# Patient Record
Sex: Male | Born: 1945 | Race: Black or African American | Hispanic: No | Marital: Single | Smoking: Former smoker
Health system: Southern US, Community
[De-identification: ages and names within clinical notes are randomized; demographics above are authoritative.]

## PROBLEM LIST (undated history)

## (undated) DIAGNOSIS — E785 Hyperlipidemia, unspecified: Secondary | ICD-10-CM

## (undated) DIAGNOSIS — C61 Malignant neoplasm of prostate: Secondary | ICD-10-CM

## (undated) DIAGNOSIS — I4819 Other persistent atrial fibrillation: Secondary | ICD-10-CM

## (undated) DIAGNOSIS — N200 Calculus of kidney: Secondary | ICD-10-CM

## (undated) DIAGNOSIS — S37001A Unspecified injury of right kidney, initial encounter: Secondary | ICD-10-CM

## (undated) DIAGNOSIS — Z992 Dependence on renal dialysis: Secondary | ICD-10-CM

## (undated) DIAGNOSIS — S2239XA Fracture of one rib, unspecified side, initial encounter for closed fracture: Secondary | ICD-10-CM

## (undated) DIAGNOSIS — K5730 Diverticulosis of large intestine without perforation or abscess without bleeding: Secondary | ICD-10-CM

## (undated) DIAGNOSIS — Z87442 Personal history of urinary calculi: Secondary | ICD-10-CM

## (undated) DIAGNOSIS — I10 Essential (primary) hypertension: Secondary | ICD-10-CM

## (undated) DIAGNOSIS — N261 Atrophy of kidney (terminal): Secondary | ICD-10-CM

## (undated) DIAGNOSIS — I5032 Chronic diastolic (congestive) heart failure: Secondary | ICD-10-CM

## (undated) DIAGNOSIS — N186 End stage renal disease: Secondary | ICD-10-CM

## (undated) HISTORY — DX: Calculus of kidney: N20.0

## (undated) HISTORY — DX: Atrophy of kidney (terminal): N26.1

## (undated) HISTORY — DX: End stage renal disease: N18.6

## (undated) HISTORY — DX: Diverticulosis of large intestine without perforation or abscess without bleeding: K57.30

## (undated) HISTORY — DX: Unspecified injury of right kidney, initial encounter: S37.001A

## (undated) HISTORY — DX: Malignant neoplasm of prostate: C61

## (undated) HISTORY — DX: Hyperlipidemia, unspecified: E78.5

## (undated) HISTORY — DX: Personal history of urinary calculi: Z87.442

## (undated) HISTORY — DX: Essential (primary) hypertension: I10

## (undated) HISTORY — DX: Other persistent atrial fibrillation: I48.19

## (undated) HISTORY — DX: Fracture of one rib, unspecified side, initial encounter for closed fracture: S22.39XA

## (undated) HISTORY — DX: Dependence on renal dialysis: Z99.2

## (undated) HISTORY — DX: Chronic diastolic (congestive) heart failure: I50.32

---

## 1999-06-01 ENCOUNTER — Encounter: Payer: Self-pay | Admitting: Neurosurgery

## 1999-06-01 ENCOUNTER — Ambulatory Visit (HOSPITAL_COMMUNITY): Admission: RE | Admit: 1999-06-01 | Discharge: 1999-06-01 | Payer: Self-pay | Admitting: Neurosurgery

## 2000-07-03 DIAGNOSIS — C61 Malignant neoplasm of prostate: Secondary | ICD-10-CM

## 2000-07-03 HISTORY — PX: PROSTATECTOMY: SHX69

## 2000-07-03 HISTORY — DX: Malignant neoplasm of prostate: C61

## 2005-07-03 DIAGNOSIS — S2239XA Fracture of one rib, unspecified side, initial encounter for closed fracture: Secondary | ICD-10-CM

## 2005-07-03 HISTORY — DX: Fracture of one rib, unspecified side, initial encounter for closed fracture: S22.39XA

## 2006-03-26 ENCOUNTER — Ambulatory Visit: Payer: Self-pay | Admitting: Infectious Diseases

## 2006-04-02 ENCOUNTER — Ambulatory Visit: Payer: Self-pay | Admitting: Infectious Diseases

## 2007-09-03 ENCOUNTER — Ambulatory Visit: Payer: Self-pay | Admitting: Specialist

## 2008-02-24 ENCOUNTER — Ambulatory Visit: Payer: Self-pay | Admitting: Family Medicine

## 2008-12-04 ENCOUNTER — Emergency Department: Payer: Self-pay | Admitting: Emergency Medicine

## 2009-07-03 DIAGNOSIS — Z992 Dependence on renal dialysis: Secondary | ICD-10-CM

## 2009-07-03 DIAGNOSIS — N186 End stage renal disease: Secondary | ICD-10-CM

## 2009-07-03 HISTORY — DX: End stage renal disease: N18.6

## 2009-07-03 HISTORY — DX: End stage renal disease: Z99.2

## 2009-09-28 ENCOUNTER — Ambulatory Visit: Payer: Self-pay | Admitting: Internal Medicine

## 2009-10-01 ENCOUNTER — Ambulatory Visit: Payer: Self-pay | Admitting: Specialist

## 2009-12-31 HISTORY — PX: OTHER SURGICAL HISTORY: SHX169

## 2011-03-18 ENCOUNTER — Encounter: Payer: Self-pay | Admitting: Internal Medicine

## 2011-03-20 ENCOUNTER — Other Ambulatory Visit: Payer: Self-pay | Admitting: Internal Medicine

## 2011-03-21 ENCOUNTER — Telehealth: Payer: Self-pay | Admitting: Internal Medicine

## 2011-03-21 ENCOUNTER — Other Ambulatory Visit: Payer: Self-pay | Admitting: *Deleted

## 2011-03-21 NOTE — Telephone Encounter (Signed)
PT WIFE CALLED WANTS SAMPLES OF AMLODIPENE   5 MG  PT IS COMPLETELY OUT.  PLEASE SEND RX TO [facility]

## 2011-03-23 ENCOUNTER — Other Ambulatory Visit: Payer: Self-pay | Admitting: Internal Medicine

## 2011-06-16 ENCOUNTER — Other Ambulatory Visit: Payer: Self-pay | Admitting: Internal Medicine

## 2011-06-16 MED ORDER — AMLODIPINE BESYLATE 5 MG PO TABS: 5.0000 mg | ORAL_TABLET | Freq: Every day | ORAL | Status: DC

## 2011-06-19 NOTE — Telephone Encounter (Signed)
PLEASE RESEND MED.

## 2011-10-16 ENCOUNTER — Other Ambulatory Visit: Payer: Self-pay | Admitting: Internal Medicine

## 2011-10-16 ENCOUNTER — Telehealth: Payer: Self-pay | Admitting: Internal Medicine

## 2011-10-16 NOTE — Telephone Encounter (Signed)
Rx has been called in.  Patient notified. 

## 2011-10-16 NOTE — Telephone Encounter (Signed)
[phone] Pt checking on his refill for amlodipine  5mg  1 daily [facility] in [place] Pt has 1 pill left

## 2012-01-17 ENCOUNTER — Encounter: Payer: Self-pay | Admitting: Internal Medicine

## 2012-01-17 ENCOUNTER — Ambulatory Visit (INDEPENDENT_AMBULATORY_CARE_PROVIDER_SITE_OTHER): Payer: Medicare Other | Admitting: Internal Medicine

## 2012-01-17 VITALS — BP 150/88 | HR 87 | Temp 98.7°F | Ht 72.0 in | Wt 186.0 lb

## 2012-01-17 DIAGNOSIS — C649 Malignant neoplasm of unspecified kidney, except renal pelvis: Secondary | ICD-10-CM

## 2012-01-17 DIAGNOSIS — R5383 Other fatigue: Secondary | ICD-10-CM

## 2012-01-17 DIAGNOSIS — E785 Hyperlipidemia, unspecified: Secondary | ICD-10-CM

## 2012-01-17 DIAGNOSIS — R002 Palpitations: Secondary | ICD-10-CM

## 2012-01-17 LAB — CBC WITH DIFFERENTIAL/PLATELET
Basophils Absolute: 0.1 10*3/uL (ref 0.0–0.1)
Eosinophils Absolute: 0.2 10*3/uL (ref 0.0–0.7)
Lymphocytes Relative: 28 % (ref 12.0–46.0)
MCHC: 32.9 g/dL (ref 30.0–36.0)
Monocytes Relative: 11.4 % (ref 3.0–12.0)
Neutro Abs: 3.1 10*3/uL (ref 1.4–7.7)
Neutrophils Relative %: 55.7 % (ref 43.0–77.0)
Platelets: 230 10*3/uL (ref 150.0–400.0)
RDW: 13.7 % (ref 11.5–14.6)

## 2012-01-17 LAB — TSH: TSH: 0.73 u[IU]/mL (ref 0.35–5.50)

## 2012-01-17 MED ORDER — ESCITALOPRAM OXALATE 10 MG PO TABS: 10.0000 mg | ORAL_TABLET | Freq: Every day | ORAL | Status: DC

## 2012-01-17 NOTE — Patient Instructions (Addendum)
Measure your pulse and your bp during next episode:  Pulse =  # heart beats in 10 secs  X 6    (normal is 60 to 85)   Have your wife also observe your breathing pattern while snoring ( to see if apneic spells are occuring)   Start the escitalopram at 1/2 tablet daily with meal for one week, then increase to 1 full tablet

## 2012-01-17 NOTE — Progress Notes (Signed)
Patient ID: David Mueller, male   DOB: [birth year], 66 y.o.   MRN: [id number]    Patient Active Problem List  Diagnosis  . Closed internal injury of right kidney  . Chronic kidney disease (CKD), stage III (moderate)  . Rib fracture  . Diverticulosis of colon  . H/O renal calculi  . Fatigue  . Renal cell carcinoma  . Hyperlipidemia  . Hypertension  . Palpitations    Subjective:  CC:   Chief Complaint  Patient presents with  . Fatigue  . Palpitations    at night about twice a week at times    HPI:   David Mueller is a 66 y.o. male who presents as a new patient to this clinic, last seen Jan 2012 at old office and with the chief complaint of  Fatigue and malaise with anhedonia and low energy. Symptoms have been present for about 3 months.aggravated by his battle with renal cell carcinoma as well as his mother's unhappiness at being moved to an assisted living facility due to increasingly unsafe situation at home requiring constant supervision. Symptoms are accompanied by occasional palpitations lasting 30-35 minutes. During these episodes he feels panicky. They have not woken him from sleep. And not accompanied by chest pain or dizziness. His sleep is poor. He snores. His wife thinks he is depressed.     Past Medical History  Diagnosis Date  . 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 Nephrolithiasis     obstructive nephrolithiasis  . Nephrolithiasis     obstructive nephrolithiasis with renal failure  . Renal cell carcinoma July 2011    s/p left heminephrectomy [facility]  . Prostate cancer 2002    s/p prostatectomy  . Closed internal injury of right kidney     secondnary to ureteral obstruction  . Chronic kidney disease (CKD), stage III (moderate) 2011    atrophic right kidney, left  heminephrectomy  . Rib fracture 2007    secondary to trauma, ninth left  . Diverticulosis of colon   . H/O renal calculi   . Hyperlipidemia   . Hypertension      Past Surgical History  Procedure Date  . Heminephrectomy July 2011    [facility]; renal cell  CA  . Prostatectomy 2002    Family History  Problem Relation Age of Onset  . Hyperlipidemia Mother   . Hypertension Mother   . Atrial fibrillation Mother   . Heart disease Father   . Hypertension Sister   . Hyperlipidemia Sister     History   Social History  . Marital Status: Single    Spouse Name: N/A    Number of Children: N/A  . Years of Education: N/A   Occupational History  . Retired  [employer]   Social History Main Topics  . Smoking status: Current Some Day Smoker    Types: Pipe  . Smokeless tobacco: Not on file   Comment: Quit smoking cigarettes many years ago   . Alcohol Use: No  . Drug Use: Not on file  . Sexually Active: Not on file   Other Topics Concern  . Not on file   Social History Narrative   Retired from Software engineer at [employer], also works with the [employer] school system    Allergies  Allergen Reactions  . Contrast Media (Iodinated Diagnostic Agents)    Allergies  Allergen Reactions  . Contrast Media (Iodinated Diagnostic Agents)     Review of Systems:  Review of Systems  Constitutional: Positive for malaise/fatigue. Negative for fever, chills and weight loss.  HENT: Negative for tinnitus.   Eyes: Negative.   Respiratory: Negative for shortness of breath and stridor.   Cardiovascular: Positive for palpitations. Negative for leg swelling and PND.  Gastrointestinal: Negative for heartburn, abdominal pain and blood in stool.  Skin: Negative for rash.  Neurological: Negative for dizziness, tremors, sensory change, loss of consciousness and headaches.  Endo/Heme/Allergies: Negative for polydipsia. Does not bruise/bleed easily.  Psychiatric/Behavioral: Positive for depression. Negative for suicidal ideas, memory loss and substance abuse. The patient is nervous/anxious.   .       Objective:  BP 150/88  Pulse  87  Temp 98.7 F (37.1 C) (Oral)  Ht 6' (1.829 m)  Wt 186 lb (84.369 kg)  BMI 25.23 kg/m2  SpO2 97%  General appearance: alert, cooperative and appears stated age Ears: normal TM's and external ear canals both ears Throat: lips, mucosa, and tongue normal; teeth and gums normal Neck: no adenopathy, no carotid bruit, supple, symmetrical, trachea midline and thyroid not enlarged, symmetric, no tenderness/mass/nodules Back: symmetric, no curvature. ROM normal. No CVA tenderness. Lungs: clear to auscultation bilaterally Heart: regular rate and rhythm, S1, S2 normal, no murmur, click, rub or gallop Abdomen: soft, non-tender; bowel sounds normal; no masses,  no organomegaly Pulses: 2+ and symmetric Skin: Skin color, texture, turgor normal. No rashes or lesions Lymph nodes: Cervical, supraclavicular, and axillary nodes normal.  Assessment and Plan:  Chronic kidney disease (CKD), stage III (moderate) Secondary to acquired atrophy of right kidney in 2009 secondary to hydronephrosis and ureteral obstruction complicated by a left heminephrectomy for renal cell carcinoma in July of 2011 his creatinine has not been checked in over a year since I have not seen him since January 2012. At that time his creatinine was 2.7 repeat today shows no loss in GFR which is 27 ml/min. his urologist is in [place] but I'm recommending that he see a local nephrologist for monitoring of kidney function.  Fatigue Current symptoms may be due to sleep apnea versus  depression. Hypothyroidism has been ruled out as has anemia. We have elected to start antidepressant therapy and I have asked him to ask his wife to observe his sleeping pattern for apneic spells. He will return in one month.  Renal cell carcinoma Diagnosed in June 2011 status post left heminephrectomy at [place]. He follows up with his urologist/gynecologist in September for a PET scan.  Hyperlipidemia Managed with Zocor, previously on Vytorin. Goal LDL is 100. He has not had fasting lipids in over a year. Hepatic panel today was normal  Palpitations Unclear whether he is having palpitations or panic attacks, or both. Electrolytes and thyroid function are normal. I have instructed him on how to check his pulse so that he may evaluate this next time it happens. We discussed setting him up with a Holter monitor. However the episodes are very infrequent require a 30 day monitor at this point he wants to hold off until we treat his anxiety.   Updated Medication List Outpatient Encounter Prescriptions as of 01/17/2012  Medication Sig Dispense Refill  . amLODipine (NORVASC) 5 MG tablet TAKE ONE TABLET BY MOUTH EVERY DAY FOR HYPERTENSION  30 tablet  5  . simvastatin (ZOCOR) 40 MG tablet TAKE ONE TABLET BY MOUTH EVERY DAY  90 tablet  3  . escitalopram (LEXAPRO) 10 MG tablet Take 1 tablet (10 mg total) by mouth daily.  30 tablet  3  . DISCONTD: amLODipine (NORVASC) 2.5 MG tablet Take 2.5 mg by mouth daily.        Marland Kitchen DISCONTD: amLODipine (NORVASC) 5 MG tablet Take 1 tablet (5 mg total) by mouth daily.  30 tablet  6  . DISCONTD: losartan (COZAAR) 100 MG tablet Take 100 mg by mouth daily.           Orders Placed This Encounter  Procedures  . TSH  . COMPLETE METABOLIC PANEL WITH GFR  . LDL cholesterol, direct  . CBC with Differential    No Follow-up on file.

## 2012-01-18 ENCOUNTER — Encounter: Payer: Self-pay | Admitting: Internal Medicine

## 2012-01-18 DIAGNOSIS — K5730 Diverticulosis of large intestine without perforation or abscess without bleeding: Secondary | ICD-10-CM | POA: Insufficient documentation

## 2012-01-18 DIAGNOSIS — I4891 Unspecified atrial fibrillation: Secondary | ICD-10-CM | POA: Insufficient documentation

## 2012-01-18 DIAGNOSIS — E785 Hyperlipidemia, unspecified: Secondary | ICD-10-CM | POA: Insufficient documentation

## 2012-01-18 DIAGNOSIS — R5383 Other fatigue: Secondary | ICD-10-CM | POA: Insufficient documentation

## 2012-01-18 DIAGNOSIS — S37001A Unspecified injury of right kidney, initial encounter: Secondary | ICD-10-CM | POA: Insufficient documentation

## 2012-01-18 DIAGNOSIS — C689 Malignant neoplasm of urinary organ, unspecified: Secondary | ICD-10-CM | POA: Insufficient documentation

## 2012-01-18 DIAGNOSIS — Z87442 Personal history of urinary calculi: Secondary | ICD-10-CM | POA: Insufficient documentation

## 2012-01-18 DIAGNOSIS — N185 Chronic kidney disease, stage 5: Secondary | ICD-10-CM | POA: Insufficient documentation

## 2012-01-18 DIAGNOSIS — S2239XA Fracture of one rib, unspecified side, initial encounter for closed fracture: Secondary | ICD-10-CM | POA: Insufficient documentation

## 2012-01-18 DIAGNOSIS — I10 Essential (primary) hypertension: Secondary | ICD-10-CM | POA: Insufficient documentation

## 2012-01-18 LAB — COMPLETE METABOLIC PANEL WITH GFR
ALT: 18 U/L (ref 0–53)
CO2: 27 mEq/L (ref 19–32)
Calcium: 9.6 mg/dL (ref 8.4–10.5)
Chloride: 106 mEq/L (ref 96–112)
GFR, Est African American: 34 mL/min — ABNORMAL LOW
GFR, Est Non African American: 29 mL/min — ABNORMAL LOW
Glucose, Bld: 75 mg/dL (ref 70–99)
Sodium: 139 mEq/L (ref 135–145)
Total Protein: 7 g/dL (ref 6.0–8.3)

## 2012-01-18 NOTE — Assessment & Plan Note (Signed)
Diagnosed in June 2011 status post left heminephrectomy at [place]. He follows up with his urologist/gynecologist in September for a PET scan.

## 2012-01-18 NOTE — Assessment & Plan Note (Signed)
Unclear whether he is having palpitations or panic attacks, or both. Electrolytes and thyroid function are normal. I have instructed him on how to check his pulse so that he may evaluate this next time it happens. We discussed setting him up with a Holter monitor. However the episodes are very infrequent require a 30 day monitor at this point he wants to hold off until we treat his anxiety.

## 2012-01-18 NOTE — Assessment & Plan Note (Signed)
Managed with Zocor, previously on Vytorin. Goal LDL is 100. He has not had fasting lipids in over a year. Hepatic panel today was normal

## 2012-01-18 NOTE — Assessment & Plan Note (Addendum)
Secondary to acquired atrophy of right kidney in 2009 secondary to hydronephrosis and ureteral obstruction complicated by a left heminephrectomy for renal cell carcinoma in July of 2011 his creatinine has not been checked in over a year since I have not seen him since January 2012. At that time his creatinine was 2.7 repeat today shows no loss in GFR which is 27 ml/min. his urologist is in [place] but I'm recommending that he see a local nephrologist for monitoring of kidney function.

## 2012-01-18 NOTE — Assessment & Plan Note (Signed)
Current symptoms may be due to sleep apnea versus depression. Hypothyroidism has been ruled out as has anemia. We have elected to start antidepressant therapy and I have asked him to ask his wife to observe his sleeping pattern for apneic spells. He will return in one month.

## 2012-01-19 ENCOUNTER — Telehealth: Payer: Self-pay | Admitting: Internal Medicine

## 2012-01-19 NOTE — Telephone Encounter (Signed)
Patient notified that labs were normal per Dr. Darrick Huntsman.

## 2012-01-19 NOTE — Telephone Encounter (Signed)
Pt called checking on lab results that was done on 7/17

## 2012-01-20 NOTE — Addendum Note (Signed)
Addended by: Duncan Dull on: 01/20/2012 05:42 PM   Modules accepted: Orders

## 2012-01-26 ENCOUNTER — Encounter: Payer: Self-pay | Admitting: Internal Medicine

## 2012-02-08 ENCOUNTER — Telehealth: Payer: Self-pay | Admitting: Internal Medicine

## 2012-02-08 NOTE — Telephone Encounter (Signed)
Pt spouse came in  Stated dr Darrick Huntsman was going to give ms Baria a new rx [facility] [place]

## 2012-02-09 NOTE — Telephone Encounter (Signed)
Left message asking patient to return my call, because the message does not make sense.

## 2012-02-13 NOTE — Telephone Encounter (Signed)
Left message asking patient to call back

## 2012-02-16 NOTE — Telephone Encounter (Signed)
Left another message asking patient to call, will close note and wait for patient to call us back.

## 2012-02-21 ENCOUNTER — Ambulatory Visit: Payer: Medicare Other | Admitting: Internal Medicine

## 2012-04-10 ENCOUNTER — Other Ambulatory Visit: Payer: Self-pay | Admitting: Internal Medicine

## 2012-04-10 NOTE — Telephone Encounter (Signed)
Pt is needing refill on Amlodipine 5 mg and Simbastatin 40 mg and he uses [facility] in [place].

## 2012-04-12 ENCOUNTER — Other Ambulatory Visit: Payer: Self-pay | Admitting: Internal Medicine

## 2012-04-12 NOTE — Telephone Encounter (Signed)
Pt is completely out of meds and is needing refill as soon as possible.

## 2012-04-13 ENCOUNTER — Emergency Department: Payer: Self-pay | Admitting: Emergency Medicine

## 2012-04-14 LAB — CBC
HCT: 37 % — ABNORMAL LOW (ref 40.0–52.0)
HGB: 12.3 g/dL — ABNORMAL LOW (ref 13.0–18.0)
MCH: 30.9 pg (ref 26.0–34.0)
MCHC: 33.3 g/dL (ref 32.0–36.0)
MCV: 93 fL (ref 80–100)
RBC: 4 10*6/uL — ABNORMAL LOW (ref 4.40–5.90)
RDW: 13.4 % (ref 11.5–14.5)

## 2012-04-14 LAB — COMPREHENSIVE METABOLIC PANEL
Albumin: 4.3 g/dL (ref 3.4–5.0)
Alkaline Phosphatase: 79 U/L (ref 50–136)
Calcium, Total: 8.5 mg/dL (ref 8.5–10.1)
Creatinine: 3.44 mg/dL — ABNORMAL HIGH (ref 0.60–1.30)
EGFR (Non-African Amer.): 18 — ABNORMAL LOW
Glucose: 178 mg/dL — ABNORMAL HIGH (ref 65–99)
Osmolality: 293 (ref 275–301)
SGOT(AST): 51 U/L — ABNORMAL HIGH (ref 15–37)
SGPT (ALT): 29 U/L (ref 12–78)
Sodium: 141 mmol/L (ref 136–145)

## 2012-04-14 LAB — PROTIME-INR
INR: 1
Prothrombin Time: 13.2 secs (ref 11.5–14.7)

## 2012-04-14 LAB — APTT: Activated PTT: 27 secs (ref 23.6–35.9)

## 2012-04-14 LAB — URINALYSIS, COMPLETE
Bacteria: NONE SEEN
Glucose,UR: NEGATIVE mg/dL (ref 0–75)
Ketone: NEGATIVE
Nitrite: NEGATIVE
Ph: 7 (ref 4.5–8.0)

## 2012-04-15 LAB — URINE CULTURE

## 2012-04-16 MED ORDER — SIMVASTATIN 40 MG PO TABS: 40.0000 mg | ORAL_TABLET | Freq: Every day | ORAL | Status: DC

## 2012-04-26 ENCOUNTER — Telehealth: Payer: Self-pay | Admitting: Internal Medicine

## 2012-04-26 MED ORDER — PRAVASTATIN SODIUM 40 MG PO TABS: 40.0000 mg | ORAL_TABLET | Freq: Every day | ORAL | Status: DC

## 2012-04-26 NOTE — Telephone Encounter (Signed)
Have received nothing from pharmacy.  Statin changed to pravastatin in epic and [facility]

## 2012-04-26 NOTE — Telephone Encounter (Signed)
Caller: Public relations account executive; Patient Name: Theone Murdoch; PCP: Duncan Dull (Adults only); Best Callback Phone Number: [phone].  Pharmacist states patient taking amlodipine and simvastatin.  States simvastatin should not exceed 20mg  while taking amlodipine, and patient is taking 40mg  daily.  In addition, states recently hospitalized and discharged with pacerone, which also is cautioned while using simvastatin; increased risk of rhabdo.  Suggests pravastatin instead, as an option which has less likelihood to cause muscle issues.  States faxed note to Dr. Darrick Huntsman 04/24/12 but had not heard back from her regarding this.  Pharmacist states he did tell the patient 04/24/12 to hold the simvastatin until he heard back from Dr. Darrick Huntsman.   Info to office for staff/provider review/callback. May reach pharmacist at [phone].

## 2012-04-29 NOTE — Telephone Encounter (Signed)
Patient notified by phone of Rx pravastatin.

## 2012-05-07 ENCOUNTER — Telehealth: Payer: Self-pay | Admitting: Internal Medicine

## 2012-05-07 NOTE — Telephone Encounter (Signed)
Yes he can have PPD read by  RN tomorrow. Make him an appt for RN visit

## 2012-05-07 NOTE — Telephone Encounter (Signed)
David Mueller a nurse practitioner from [facility] was calling and wanting to know if [name] could come by tomorrow and have his PPd read that he had done at [facility]. David Mueller stated that the pt will be starting dialysis out this way and it is hard for him to travel they were wanting to know if this would be ok ???

## 2012-05-08 NOTE — Telephone Encounter (Signed)
Tried calling pt and all his numbers are disconnected.

## 2012-05-15 ENCOUNTER — Ambulatory Visit: Payer: Medicare Other | Admitting: Internal Medicine

## 2012-05-16 ENCOUNTER — Ambulatory Visit (INDEPENDENT_AMBULATORY_CARE_PROVIDER_SITE_OTHER): Payer: Medicare Other | Admitting: Internal Medicine

## 2012-05-16 ENCOUNTER — Encounter: Payer: Self-pay | Admitting: Internal Medicine

## 2012-05-16 ENCOUNTER — Telehealth: Payer: Self-pay

## 2012-05-16 VITALS — BP 120/66 | HR 79 | Temp 98.3°F | Resp 12 | Ht 72.0 in | Wt 172.5 lb

## 2012-05-16 DIAGNOSIS — F329 Major depressive disorder, single episode, unspecified: Secondary | ICD-10-CM

## 2012-05-16 DIAGNOSIS — Z87442 Personal history of urinary calculi: Secondary | ICD-10-CM

## 2012-05-16 DIAGNOSIS — R5383 Other fatigue: Secondary | ICD-10-CM

## 2012-05-16 DIAGNOSIS — N185 Chronic kidney disease, stage 5: Secondary | ICD-10-CM

## 2012-05-16 DIAGNOSIS — E785 Hyperlipidemia, unspecified: Secondary | ICD-10-CM

## 2012-05-16 DIAGNOSIS — I4891 Unspecified atrial fibrillation: Secondary | ICD-10-CM

## 2012-05-16 MED ORDER — ALPRAZOLAM 0.25 MG PO TABS: 0.2500 mg | ORAL_TABLET | Freq: Two times a day (BID) | ORAL | Status: DC | PRN

## 2012-05-16 MED ORDER — MIRTAZAPINE 7.5 MG PO TABS: 7.5000 mg | ORAL_TABLET | Freq: Every day | ORAL | Status: DC

## 2012-05-16 MED ORDER — ALPRAZOLAM 0.25 MG PO TABS: 0.2500 mg | ORAL_TABLET | Freq: Every evening | ORAL | Status: DC | PRN

## 2012-05-16 NOTE — Progress Notes (Signed)
Patient ID: David Mueller, male   DOB: September 11, 1945, 66 y.o.   MRN: [id number]  Patient Active Problem List  Diagnosis  . Closed internal injury of right kidney  . Chronic kidney disease (CKD), stage V  . Rib fracture  . Diverticulosis of colon  . H/O renal calculi  . Fatigue  . Renal cell carcinoma  . Hyperlipidemia  . Hypertension  . Atrial fibrillation  . Major depressive disorder    Subjective:  CC:   Chief Complaint  Patient presents with  . Follow-up    HPI:   David Mueller a 66 y.o. male who presents for  Hospital followup. He was admitted to [facility] on October 12 after being transferred from [facility] emergency room with acute hemorrhage surrounding the left kidney which occurred following the placement of a double-J ureteral stent in the left renal pelvis by his urologist at [facility]. He presented to [facility] with severe left sided flank pain.  His creatinine at that time was 3.44 hemoglobin 12.3. CT of the abdomen and pelvis without contrast was done. After transfer to [facility] he spent several days in the intensive care unit for arrhythmia and renal failure. He underwent dialysis. He was discharged October 23 and was readmitted on October 30 with arrhythmia and severe anemia requiring transfusion. He was discharged on November 6.  The left kidney was cauterized in several places to stop the re bleeding. He  is  now receiving dialysis in [place] at the [facility] under the direction of nephrologist is Urban Gibson.  He feels rough.  He has  energy, no appetite. He is depressed.  He has a history of an renolithiasis requiring prior stents. He has an atrophic right kidney and a partial left nephrectomy secondary to renal cell CA. He has had difficulty tolerating the dialysis treatments due to recurrent hypotensive events . BP dropped to 80/60 during first outpatient dialysis which lasted  4 hours for the first session.  His wife and he had extensive conversations with Dr.  Luther Redo regarding the length of dialysis as the patient is under the impression that 4 hours is too long for him since the sessions in [place] for 3 hours. He is on the  [facility] Wed Friday schedule at is slowly improving.  He has not worked since this ordeal. His wife is working.   Past Medical History  Diagnosis Date  . 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 Nephrolithiasis     obstructive nephrolithiasis  . Nephrolithiasis     obstructive nephrolithiasis with renal failure  . Renal cell carcinoma July 2011    s/p left heminephrectomy [facility]  . Prostate cancer 2002    s/p prostatectomy  . Closed internal injury of right kidney     secondnary to ureteral obstruction  . Chronic kidney disease (CKD), stage III (moderate) 2011    atrophic right kidney, left  heminephrectomy  . Rib fracture 2007    secondary to trauma, ninth left  . Diverticulosis of colon   . H/O renal calculi   . Hyperlipidemia   . Hypertension     Past Surgical History  Procedure Date  . Heminephrectomy July 2011    [facility]; renal cell  CA  . Prostatectomy 2002         The following portions of the patient's history were reviewed and updated as appropriate: Allergies, current medications, and problem list.    Review of Systems:   12  Pt  review of systems was negative except those addressed in the HPI,     History   Social History  . Marital Status: Single    Spouse Name: N/A    Number of Children: N/A  . Years of Education: N/A   Occupational History  . Retired  [employer]   Social History Main Topics  . Smoking status: Current Some Day Smoker    Types: Pipe  . Smokeless tobacco: Not on file     Comment: Quit smoking cigarettes many years ago   . Alcohol Use: No  . Drug Use: Not on file  . Sexually Active: Not on file   Other Topics Concern  . 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 ROM normal. No CVA tenderness. Lungs: clear to auscultation bilaterally Heart: regular rate and rhythm, S1, S2 normal, no murmur, click, rub or gallop Abdomen: soft, non-tender; bowel sounds normal; no masses,  no organomegaly Pulses: 2+ and symmetric Skin: Skin color, texture, turgor normal. No rashes or lesions Lymph nodes: Cervical, supraclavicular, and axillary nodes normal.  Assessment and Plan:  Atrial fibrillation Presumed, given his history of palpitations and ICU admission followed by initiation of amiodarone. He appears to be in sinus rhythm now by exam. He's not a candidate for Coumadin given his recurrent perinephric hemorrhage.  Major depressive disorder Resulting from continued decline in physical health secondary to renal cell carcinoma with recurrence now in renal failure requiring dialysis. He has no energy no appetite and would like therapy. We discussed starting mirtazapine for appetite stimulation and improvement in mood. He started taking Megace. I have discussed the addition of this medication with Dr. Luther Redo,  his local nephrologist who is in agreement. We'll start with 7.5 mg and try to titrate up to 30 mg over the next several months. Patient will return in one month.  H/O renal calculi He has a history of recurrent bilateral ureteral calculi. Prior episodes have caused atrophy of the right kidney. Recent episode involved left  kidney and placement of a double-J ureteral stent resulted in a large perinephric hemorrhage requiring admission, transfusion, and cauterization.   Fatigue Multifactorial, secondary to atrial fibrillation, anemia, renal failure, and depression. He follows up with Dr. Luther Redo  tomorrow and will have a repeat hemoglobin check. Mirtazapine has been started. Will titrate his dose of Coumadin next several months as tolerated.  Chronic kidney disease (CKD), stage V Now requiring dialysis, under the direction of Dr. Luther Redo at the [facility] in [place]. Schedule is Monday Wednesday Friday.  Hyperlipidemia His statin was changed from simvastatin to pravastatin for safety concerns given his concurrent use of amiodarone and amlodipine.   Updated Medication List Outpatient Encounter Prescriptions as of 05/16/2012  Medication Sig Dispense Refill  . amiodarone (PACERONE) 200 MG tablet Take 200 mg by mouth daily.      Marland Kitchen amLODipine (NORVASC)  5 MG tablet TAKE ONE TABLET BY MOUTH EVERY DAY FOR HYPERTENSION.  30 tablet  4  . docusate sodium (COLACE) 100 MG capsule Take 100 mg by mouth 2 (two) times daily.      Marland Kitchen HYDROcodone-acetaminophen (NORCO/VICODIN) 5-325 MG per tablet Take 1 tablet by mouth every 6 (six) hours as needed.      . magnesium chloride (SLOW-MAG) 64 MG TBEC Take 2 tablets by mouth. Take 2 tablets for 7 days      . megestrol (MEGACE) 400 MG/10ML suspension Take 200 mg by mouth daily.      . metoprolol tartrate (LOPRESSOR) 25 MG tablet Take 25 mg by mouth 2 (two) times daily.      . Multiple Vitamins-Minerals (CENTRUM SILVER PO) Take by mouth daily.      . pantoprazole (PROTONIX) 40 MG tablet Take 40 mg by mouth daily.      . pravastatin (PRAVACHOL) 40 MG tablet Take 1 tablet (40 mg total) by mouth daily.  90 tablet  3  . promethazine (PHENERGAN) 25 MG tablet Take 25 mg by mouth every 6 (six) hours as needed.      . senna-docusate (SENOKOT-S) 8.6-50 MG per tablet Take 1 tablet by mouth daily.       . sevelamer (RENVELA) 800 MG tablet Take 800 mg by mouth 3 (three) times daily with meals. Take 2 pills 3 times a day      . [DISCONTINUED] simvastatin (ZOCOR) 20 MG tablet Take 20 mg by mouth every evening.      Marland Kitchen ALPRAZolam (XANAX) 0.25 MG tablet Take 1 tablet (0.25 mg total) by mouth 2 (two) times daily as needed for sleep.  60 tablet  2  . escitalopram (LEXAPRO) 10 MG tablet Take 1 tablet (10 mg total) by mouth daily.  30 tablet  3  . mirtazapine (REMERON) 7.5 MG tablet Take 1 tablet (7.5 mg total) by mouth at bedtime.  30 tablet  2  . [DISCONTINUED] ALPRAZolam (XANAX) 0.25 MG tablet Take 1 tablet (0.25 mg total) by mouth at bedtime as needed for sleep.  30 tablet  2

## 2012-05-16 NOTE — Telephone Encounter (Signed)
Pt forgot to mention that he needed a permanent handicap sticker Please advise when ready to pick up

## 2012-05-16 NOTE — Patient Instructions (Addendum)
I am prescribing alprazolam for your nerves.  Take as needed for anxiety.  The mirtazipine is to be taken at bedtime .  It will help your mood,  Appetite and energy level but we may need to increase the dose after 2 weeks to 15 mg daily  .

## 2012-05-18 ENCOUNTER — Encounter: Payer: Self-pay | Admitting: Internal Medicine

## 2012-05-19 ENCOUNTER — Encounter: Payer: Self-pay | Admitting: Internal Medicine

## 2012-05-19 DIAGNOSIS — F329 Major depressive disorder, single episode, unspecified: Secondary | ICD-10-CM | POA: Insufficient documentation

## 2012-05-19 NOTE — Assessment & Plan Note (Signed)
Presumed, given his history of palpitations and ICU admission followed by initiation of amiodarone. He appears to be in sinus rhythm now by exam. He's not a candidate for Coumadin given his recurrent perinephric hemorrhage.

## 2012-05-19 NOTE — Assessment & Plan Note (Signed)
He has a history of recurrent bilateral ureteral calculi. Prior episodes have caused atrophy of the right kidney. Recent episode involved left kidney and placement of a double-J ureteral stent resulted in a large perinephric hemorrhage requiring admission, transfusion, and cauterization.

## 2012-05-19 NOTE — Assessment & Plan Note (Signed)
Now requiring dialysis, under the direction of Dr. Luther Redo at the [facility] in [place]. Schedule is Monday Wednesday Friday.

## 2012-05-19 NOTE — Assessment & Plan Note (Signed)
His statin was changed from simvastatin to pravastatin for safety concerns given his concurrent use of amiodarone and amlodipine.

## 2012-05-19 NOTE — Assessment & Plan Note (Signed)
Multifactorial, secondary to atrial fibrillation, anemia, renal failure, and depression. He follows up with Dr. Luther Redo  tomorrow and will have a repeat hemoglobin check. Mirtazapine has been started. Will titrate his dose of Coumadin next several months as tolerated.

## 2012-05-19 NOTE — Assessment & Plan Note (Signed)
Resulting from continued decline in physical health secondary to renal cell carcinoma with recurrence now in renal failure requiring dialysis. He has no energy no appetite and would like therapy. We discussed starting mirtazapine for appetite stimulation and improvement in mood. He started taking Megace. I have discussed the addition of this medication with Dr. Luther Redo,  his local nephrologist who is in agreement. We'll start with 7.5 mg and try to titrate up to 30 mg over the next several months. Patient will return in one month.

## 2012-05-21 ENCOUNTER — Ambulatory Visit: Payer: Self-pay | Admitting: Vascular Surgery

## 2012-06-03 ENCOUNTER — Telehealth: Payer: Self-pay | Admitting: Internal Medicine

## 2012-06-03 MED ORDER — MIRTAZAPINE 15 MG PO TABS: 7.5000 mg | ORAL_TABLET | Freq: Every day | ORAL | Status: DC

## 2012-06-03 NOTE — Telephone Encounter (Signed)
Do we have the permanent handicapped applications./  Where are they?  rx sent in for 15 mg dose

## 2012-06-03 NOTE — Telephone Encounter (Signed)
Pt spouse came in today to pick up handicap sticker  permant sticker  Please advise when ready to pick up.  Pt spouse stated the rx for mitzazpine needs to be increased to [facility] [place] Please advise when this is called in

## 2012-06-10 NOTE — Telephone Encounter (Signed)
Called both number for patient, both stated that it was disonnected. Handicap sticker is up front in folder J.

## 2012-07-01 ENCOUNTER — Telehealth: Payer: Self-pay | Admitting: Internal Medicine

## 2012-07-01 MED ORDER — SIMVASTATIN 20 MG PO TABS: 20.0000 mg | ORAL_TABLET | Freq: Every evening | ORAL | Status: DC

## 2012-07-01 NOTE — Telephone Encounter (Signed)
Patient calling in to get refill on simvastin 20mg  and tantoprazole 40 mg he uses [facility] in [place] and he only has enough pills through tomorrow.

## 2012-07-01 NOTE — Telephone Encounter (Signed)
Med filled.  

## 2012-07-02 ENCOUNTER — Other Ambulatory Visit: Payer: Self-pay | Admitting: Internal Medicine

## 2012-07-02 MED ORDER — SIMVASTATIN 20 MG PO TABS: 20.0000 mg | ORAL_TABLET | Freq: Every evening | ORAL | Status: DC

## 2012-07-02 NOTE — Telephone Encounter (Signed)
Med filled.  

## 2012-07-02 NOTE — Telephone Encounter (Signed)
Refill on Zocor 20 mg tablets 30 supply

## 2012-09-02 ENCOUNTER — Ambulatory Visit: Payer: Self-pay | Admitting: Vascular Surgery

## 2012-09-02 LAB — BASIC METABOLIC PANEL
Anion Gap: 7 (ref 7–16)
BUN: 44 mg/dL — ABNORMAL HIGH (ref 7–18)
Calcium, Total: 8.9 mg/dL (ref 8.5–10.1)
Chloride: 107 mmol/L (ref 98–107)
Creatinine: 10.5 mg/dL — ABNORMAL HIGH (ref 0.60–1.30)
EGFR (Non-African Amer.): 5 — ABNORMAL LOW
Glucose: 69 mg/dL (ref 65–99)
Osmolality: 291 (ref 275–301)
Potassium: 5.1 mmol/L (ref 3.5–5.1)
Sodium: 141 mmol/L (ref 136–145)

## 2012-09-02 LAB — CBC
HCT: 32.6 % — ABNORMAL LOW (ref 40.0–52.0)
MCHC: 32.2 g/dL (ref 32.0–36.0)
MCV: 92 fL (ref 80–100)
WBC: 6.8 10*3/uL (ref 3.8–10.6)

## 2012-09-03 ENCOUNTER — Ambulatory Visit: Payer: Self-pay | Admitting: Vascular Surgery

## 2012-09-15 ENCOUNTER — Other Ambulatory Visit: Payer: Self-pay | Admitting: Internal Medicine

## 2012-09-15 NOTE — Telephone Encounter (Signed)
Ok to refill alprazolam,  Authorized in epic

## 2012-09-16 NOTE — Telephone Encounter (Signed)
Meds were faxed on 3/17.

## 2012-09-30 ENCOUNTER — Ambulatory Visit: Payer: Self-pay | Admitting: Vascular Surgery

## 2012-09-30 ENCOUNTER — Telehealth: Payer: Self-pay | Admitting: Internal Medicine

## 2012-09-30 MED ORDER — AMLODIPINE BESYLATE 5 MG PO TABS: ORAL_TABLET | ORAL | Status: DC

## 2012-09-30 MED ORDER — SIMVASTATIN 20 MG PO TABS: 20.0000 mg | ORAL_TABLET | Freq: Every evening | ORAL | Status: DC

## 2012-09-30 NOTE — Telephone Encounter (Signed)
Pt spouse came in pt is changing drug stores.( PT ONLY HAS ONE PILL LEFT)  Pt needs 30 day supply of amlodipine sent to [facility] in [place]  And please send 90 day supply to the mail in   Form in box Amlodipine and simvastitin

## 2012-09-30 NOTE — Telephone Encounter (Signed)
Med filled.  

## 2012-10-01 ENCOUNTER — Encounter: Payer: Self-pay | Admitting: General Practice

## 2012-10-01 ENCOUNTER — Other Ambulatory Visit: Payer: Self-pay | Admitting: *Deleted

## 2012-10-01 MED ORDER — SIMVASTATIN 20 MG PO TABS: 20.0000 mg | ORAL_TABLET | Freq: Every evening | ORAL | Status: AC

## 2012-10-01 MED ORDER — AMLODIPINE BESYLATE 5 MG PO TABS: ORAL_TABLET | ORAL | Status: DC

## 2012-10-01 NOTE — Telephone Encounter (Signed)
Rx printed to be signed and faxed.  

## 2012-10-01 NOTE — Addendum Note (Signed)
Addended by: Theola Sequin on: 10/01/2012 12:14 PM   Modules accepted: Orders

## 2012-10-03 ENCOUNTER — Ambulatory Visit: Payer: Self-pay | Admitting: Urology

## 2012-10-03 LAB — CBC WITH DIFFERENTIAL/PLATELET
Basophil %: 1.5 %
Eosinophil #: 1.1 10*3/uL — ABNORMAL HIGH (ref 0.0–0.7)
HCT: 32 % — ABNORMAL LOW (ref 40.0–52.0)
MCH: 29.9 pg (ref 26.0–34.0)
MCHC: 32.8 g/dL (ref 32.0–36.0)
MCV: 91 fL (ref 80–100)
Neutrophil %: 50.9 %
Platelet: 293 10*3/uL (ref 150–440)
RBC: 3.5 10*6/uL — ABNORMAL LOW (ref 4.40–5.90)
WBC: 8.3 10*3/uL (ref 3.8–10.6)

## 2012-10-03 LAB — POTASSIUM: Potassium: 5 mmol/L (ref 3.5–5.1)

## 2012-10-08 ENCOUNTER — Ambulatory Visit: Payer: Self-pay | Admitting: Urology

## 2012-10-09 LAB — PATHOLOGY REPORT

## 2012-10-14 ENCOUNTER — Other Ambulatory Visit: Payer: Self-pay | Admitting: *Deleted

## 2012-10-14 MED ORDER — MEGESTROL ACETATE 400 MG/10ML PO SUSP: 200.0000 mg | Freq: Every day | ORAL | Status: AC

## 2012-11-01 ENCOUNTER — Telehealth: Payer: Self-pay | Admitting: Internal Medicine

## 2012-12-05 ENCOUNTER — Ambulatory Visit: Payer: Self-pay | Admitting: Vascular Surgery

## 2012-12-12 ENCOUNTER — Inpatient Hospital Stay: Payer: Self-pay | Admitting: Internal Medicine

## 2012-12-12 LAB — COMPREHENSIVE METABOLIC PANEL
Albumin: 3.4 g/dL (ref 3.4–5.0)
Anion Gap: 3 — ABNORMAL LOW (ref 7–16)
BUN: 14 mg/dL (ref 7–18)
Bilirubin,Total: 0.4 mg/dL (ref 0.2–1.0)
Calcium, Total: 8.8 mg/dL (ref 8.5–10.1)
Co2: 34 mmol/L — ABNORMAL HIGH (ref 21–32)
Glucose: 99 mg/dL (ref 65–99)
SGPT (ALT): 12 U/L (ref 12–78)
Sodium: 138 mmol/L (ref 136–145)
Total Protein: 7.8 g/dL (ref 6.4–8.2)

## 2012-12-12 LAB — CBC
HGB: 10.5 g/dL — ABNORMAL LOW (ref 13.0–18.0)
MCH: 30.5 pg (ref 26.0–34.0)
MCV: 93 fL (ref 80–100)
Platelet: 315 10*3/uL (ref 150–440)
RDW: 16.1 % — ABNORMAL HIGH (ref 11.5–14.5)

## 2012-12-12 LAB — CK TOTAL AND CKMB (NOT AT ARMC)
CK, Total: 118 U/L (ref 35–232)
CK, Total: 84 U/L (ref 35–232)
CK, Total: 89 U/L (ref 35–232)
CK-MB: <0.5 ng/mL — ABNORMAL LOW (ref 0.5–3.6)
CK-MB: <0.5 ng/mL — ABNORMAL LOW (ref 0.5–3.6)

## 2012-12-12 LAB — TROPONIN I
Troponin-I: <0.02 ng/mL
Troponin-I: <0.02 ng/mL
Troponin-I: <0.02 ng/mL

## 2012-12-25 ENCOUNTER — Ambulatory Visit: Payer: Medicare Other | Admitting: Internal Medicine

## 2013-02-11 ENCOUNTER — Telehealth: Payer: Self-pay | Admitting: Internal Medicine

## 2013-02-11 NOTE — Telephone Encounter (Signed)
Pt needing refill on Symbastatin 90 day supply. Please send to [facility] in [place]

## 2013-02-12 ENCOUNTER — Encounter: Payer: Self-pay | Admitting: *Deleted

## 2013-02-12 NOTE — Telephone Encounter (Signed)
Letter sent.

## 2013-02-12 NOTE — Telephone Encounter (Signed)
Patient needs his liver enzymes tested prior to refill.  Has not had one here in over a year.  If he has had one from another physician in the last 6 months,  that will suffice.  Send letter.  Do not refill. Simvastatin for now

## 2013-02-12 NOTE — Telephone Encounter (Signed)
Both numbers have been disconnected cannot reach patient  Please advise as to refill since patient has not had a lipid panel done.

## 2013-02-17 ENCOUNTER — Ambulatory Visit: Payer: Medicare Other | Admitting: Internal Medicine

## 2013-03-04 ENCOUNTER — Encounter: Payer: Self-pay | Admitting: Internal Medicine

## 2013-03-04 DIAGNOSIS — C689 Malignant neoplasm of urinary organ, unspecified: Secondary | ICD-10-CM

## 2013-03-04 DIAGNOSIS — N185 Chronic kidney disease, stage 5: Secondary | ICD-10-CM

## 2013-03-04 NOTE — Assessment & Plan Note (Signed)
NOt a candidate for cisplatin due to ESKD per [facility] Heme/Onc  [facility])

## 2013-03-11 ENCOUNTER — Ambulatory Visit: Payer: Self-pay | Admitting: Physician Assistant

## 2013-05-22 LAB — CBC
HCT: 24.9 % — ABNORMAL LOW (ref 40.0–52.0)
HGB: 8.2 g/dL — ABNORMAL LOW (ref 13.0–18.0)
MCH: 31.2 pg (ref 26.0–34.0)
MCHC: 33 g/dL (ref 32.0–36.0)
Platelet: 347 10*3/uL (ref 150–440)
RDW: 16 % — ABNORMAL HIGH (ref 11.5–14.5)
WBC: 13.7 10*3/uL — ABNORMAL HIGH (ref 3.8–10.6)

## 2013-05-23 ENCOUNTER — Inpatient Hospital Stay: Payer: Self-pay | Admitting: Internal Medicine

## 2013-05-23 DIAGNOSIS — E876 Hypokalemia: Secondary | ICD-10-CM

## 2013-05-23 DIAGNOSIS — E785 Hyperlipidemia, unspecified: Secondary | ICD-10-CM

## 2013-05-23 DIAGNOSIS — I4891 Unspecified atrial fibrillation: Secondary | ICD-10-CM

## 2013-05-23 DIAGNOSIS — I10 Essential (primary) hypertension: Secondary | ICD-10-CM

## 2013-05-23 DIAGNOSIS — J9600 Acute respiratory failure, unspecified whether with hypoxia or hypercapnia: Secondary | ICD-10-CM

## 2013-05-23 LAB — COMPREHENSIVE METABOLIC PANEL
Anion Gap: 7 (ref 7–16)
BUN: 64 mg/dL — ABNORMAL HIGH (ref 7–18)
Bilirubin,Total: 0.3 mg/dL (ref 0.2–1.0)
Calcium, Total: 8.7 mg/dL (ref 8.5–10.1)
Chloride: 101 mmol/L (ref 98–107)
Co2: 30 mmol/L (ref 21–32)
EGFR (African American): 4 — ABNORMAL LOW
Osmolality: 295 (ref 275–301)
Potassium: 5.4 mmol/L — ABNORMAL HIGH (ref 3.5–5.1)
SGOT(AST): 22 U/L (ref 15–37)
SGPT (ALT): 20 U/L (ref 12–78)
Sodium: 138 mmol/L (ref 136–145)
Total Protein: 6.8 g/dL (ref 6.4–8.2)

## 2013-05-23 LAB — BASIC METABOLIC PANEL
BUN: 63 mg/dL — ABNORMAL HIGH (ref 7–18)
Calcium, Total: 8.3 mg/dL — ABNORMAL LOW (ref 8.5–10.1)
Chloride: 102 mmol/L (ref 98–107)
Creatinine: 14.79 mg/dL — ABNORMAL HIGH (ref 0.60–1.30)
EGFR (African American): 3 — ABNORMAL LOW
EGFR (Non-African Amer.): 3 — ABNORMAL LOW
Glucose: 155 mg/dL — ABNORMAL HIGH (ref 65–99)
Osmolality: 297 (ref 275–301)
Sodium: 138 mmol/L (ref 136–145)

## 2013-05-23 LAB — TROPONIN I
Troponin-I: 0.04 ng/mL
Troponin-I: 0.04 ng/mL
Troponin-I: 0.06 ng/mL — ABNORMAL HIGH

## 2013-05-23 LAB — CK TOTAL AND CKMB (NOT AT ARMC)
CK, Total: 133 U/L (ref 35–232)
CK, Total: 189 U/L (ref 35–232)
CK-MB: 0.9 ng/mL (ref 0.5–3.6)
CK-MB: 1.2 ng/mL (ref 0.5–3.6)

## 2013-05-23 LAB — PRO B NATRIURETIC PEPTIDE: B-Type Natriuretic Peptide: 93927 pg/mL — ABNORMAL HIGH (ref 0–125)

## 2013-05-24 DIAGNOSIS — I517 Cardiomegaly: Secondary | ICD-10-CM

## 2013-05-24 LAB — CBC WITH DIFFERENTIAL/PLATELET
Basophil #: 0.1 10*3/uL (ref 0.0–0.1)
Basophil %: 0.9 %
Eosinophil #: 0.4 10*3/uL (ref 0.0–0.7)
Lymphocyte %: 10.8 %
MCH: 32.1 pg (ref 26.0–34.0)
MCHC: 34.1 g/dL (ref 32.0–36.0)
MCV: 94 fL (ref 80–100)
Monocyte #: 0.9 x10 3/mm (ref 0.2–1.0)
Neutrophil #: 4.9 10*3/uL (ref 1.4–6.5)
Neutrophil %: 69.6 %
Platelet: 214 10*3/uL (ref 150–440)
RDW: 16 % — ABNORMAL HIGH (ref 11.5–14.5)
WBC: 7 10*3/uL (ref 3.8–10.6)

## 2013-05-24 LAB — BASIC METABOLIC PANEL
Chloride: 100 mmol/L (ref 98–107)
Co2: 32 mmol/L (ref 21–32)
EGFR (Non-African Amer.): 3 — ABNORMAL LOW
Glucose: 126 mg/dL — ABNORMAL HIGH (ref 65–99)
Osmolality: 296 (ref 275–301)

## 2013-05-24 LAB — HEMOGLOBIN: HGB: 6.5 g/dL — ABNORMAL LOW (ref 13.0–18.0)

## 2013-05-24 LAB — HEMATOCRIT: HCT: 19.4 % — ABNORMAL LOW (ref 40.0–52.0)

## 2013-05-24 LAB — LIPID PANEL
Cholesterol: 107 mg/dL (ref 0–200)
HDL Cholesterol: 47 mg/dL (ref 40–60)
VLDL Cholesterol, Calc: 9 mg/dL (ref 5–40)

## 2013-05-24 LAB — TSH: Thyroid Stimulating Horm: 2.13 u[IU]/mL

## 2013-05-25 LAB — CBC WITH DIFFERENTIAL/PLATELET
Basophil %: 0.9 %
Eosinophil #: 0.5 10*3/uL (ref 0.0–0.7)
HCT: 20.7 % — ABNORMAL LOW (ref 40.0–52.0)
HGB: 7.1 g/dL — ABNORMAL LOW (ref 13.0–18.0)
MCH: 31.5 pg (ref 26.0–34.0)
Neutrophil %: 67.8 %
Platelet: 187 10*3/uL (ref 150–440)
RDW: 16.1 % — ABNORMAL HIGH (ref 11.5–14.5)

## 2013-05-25 LAB — BASIC METABOLIC PANEL
Anion Gap: 7 (ref 7–16)
BUN: 51 mg/dL — ABNORMAL HIGH (ref 7–18)
Creatinine: 12.66 mg/dL — ABNORMAL HIGH (ref 0.60–1.30)
EGFR (African American): 4 — ABNORMAL LOW
EGFR (Non-African Amer.): 4 — ABNORMAL LOW
Glucose: 110 mg/dL — ABNORMAL HIGH (ref 65–99)
Osmolality: 296 (ref 275–301)
Potassium: 4 mmol/L (ref 3.5–5.1)

## 2013-05-26 ENCOUNTER — Encounter: Payer: Self-pay | Admitting: *Deleted

## 2013-05-26 ENCOUNTER — Telehealth: Payer: Self-pay

## 2013-05-26 ENCOUNTER — Telehealth: Payer: Self-pay | Admitting: Internal Medicine

## 2013-05-26 NOTE — Telephone Encounter (Signed)
Attempted to contact pt regarding discharge from [facility] on 05/26/13..  Home, work, and emergency contact #s disconnected.

## 2013-05-26 NOTE — Telephone Encounter (Signed)
Hospital follow up scheduled on 12.3.14

## 2013-05-27 NOTE — Telephone Encounter (Signed)
Could not call for hospital follow up patient phone is out of order will try again later

## 2013-05-28 NOTE — Telephone Encounter (Signed)
FYI phones have been disconnected could not reach for hospital follow up.

## 2013-05-28 NOTE — Telephone Encounter (Signed)
Patient phone has been disconnected and the only other number I have says work and also disconnected.

## 2013-06-02 ENCOUNTER — Ambulatory Visit (INDEPENDENT_AMBULATORY_CARE_PROVIDER_SITE_OTHER): Payer: Medicare Other | Admitting: Cardiovascular Disease

## 2013-06-02 ENCOUNTER — Encounter: Payer: Self-pay | Admitting: Cardiovascular Disease

## 2013-06-02 VITALS — BP 122/84 | HR 65 | Ht 72.0 in | Wt 173.5 lb

## 2013-06-02 DIAGNOSIS — I4819 Other persistent atrial fibrillation: Secondary | ICD-10-CM | POA: Insufficient documentation

## 2013-06-02 DIAGNOSIS — I10 Essential (primary) hypertension: Secondary | ICD-10-CM

## 2013-06-02 DIAGNOSIS — I4891 Unspecified atrial fibrillation: Secondary | ICD-10-CM

## 2013-06-02 DIAGNOSIS — I5032 Chronic diastolic (congestive) heart failure: Secondary | ICD-10-CM

## 2013-06-02 MED ORDER — AMIODARONE HCL 200 MG PO TABS: 200.0000 mg | ORAL_TABLET | Freq: Every day | ORAL | Status: AC

## 2013-06-02 NOTE — Assessment & Plan Note (Signed)
He appears to be euvolemic now. I suspect that A. fib with RVR was contributing to this. I think it's important to maintain sinus rhythm given the difficulty with rate control.

## 2013-06-02 NOTE — Assessment & Plan Note (Signed)
He is currently maintaining a normal sinus rhythm on amiodarone. I recommend decreasing the dose to 200 mg once daily. I had a prolonged discussion with the patient and his wife about the indications for long-term anticoagulation.His CAHDS VASc score is 3 (hypertension, heart failure and age). The patient is extremely hesitant to start anticoagulation. Also he was noted to be significantly anemic during his hospitalization. Due to all of that, we decided to continue aspirin 81 mg once daily and readdress this issue in the near future.  I will check liver profile and thyroid function in 3 months given that he is on amiodarone.

## 2013-06-02 NOTE — Patient Instructions (Signed)
Decrease Amiodarone to 200 mg once daily.  Continue other medications.   Follow up in 3 months.

## 2013-06-02 NOTE — Assessment & Plan Note (Signed)
Blood pressure is well controlled on current medications. 

## 2013-06-02 NOTE — Progress Notes (Signed)
HPI  This is a 67 year old man who is here today for a followup visit after recent hospitalization for acute on chronic diastolic heart failure and atrial fibrillation with rapid ventricular response. He has known history of end-stage renal disease on peritoneal dialysis, recurrent nephrolithiasis, hypertension, hyperlipidemia, tobacco use and anemia of chronic kidney disease. He presented recently to [facility] with increased dyspnea and weight gain with fluid retention. He was noted to be in atrial fibrillation with rapid ventricular response. It appears that he had a set fibrillation in October of 2013 when he was hospitalized at [facility] after complications of ureteral stenting. He was placed on amiodarone at that time for one month. During his hospitalization, peritoneal solution was adjusted with effective diuresis. He was placed on IV amiodarone and subsequently converted to normal sinus rhythm. He had the current atrial fibrillation while in telemetry. We decided to keep him on amiodarone which was likely contributing to his acute on chronic diastolic heart failure. We had an extensive discussion with him about the indications for anticoagulation. He was noted to be significantly anemic with a hemoglobin around 7. Thus, anticoagulation was deferred. He had an echocardiogram done during his hospitalization which showed normal LV systolic function. He was discharged home on amiodarone 400 mg twice daily. He has been doing well since hospital discharge with no recurrent palpitations, dyspnea or chest pain.  Allergies  Allergen Reactions  . Contrast Media [Iodinated Diagnostic Agents]   . Morphine And Related      Current Outpatient Prescriptions on File Prior to Visit  Medication Sig Dispense Refill  . ALPRAZolam (XANAX) 0.25 MG tablet TAKE ONE TABLET BY MOUTH TWICE DAILY AS NEEDED FOR SLEEP  60 tablet  2  . amiodarone (PACERONE) 200 MG tablet Take 200 mg by mouth daily.      Marland Kitchen amLODipine (NORVASC)  5 MG tablet TAKE ONE TABLET BY MOUTH EVERY DAY FOR HYPERTENSION.  90 tablet  1  . docusate sodium (COLACE) 100 MG capsule Take 100 mg by mouth 2 (two) times daily.      Marland Kitchen escitalopram (LEXAPRO) 10 MG tablet Take 1 tablet (10 mg total) by mouth daily.  30 tablet  3  . HYDROcodone-acetaminophen (NORCO/VICODIN) 5-325 MG per tablet Take 1 tablet by mouth every 6 (six) hours as needed.      Marland Kitchen losartan (COZAAR) 50 MG tablet Take 50 mg by mouth daily.      . magnesium chloride (SLOW-MAG) 64 MG TBEC Take 2 tablets by mouth. Take 2 tablets for 7 days      . megestrol (MEGACE) 400 MG/10ML suspension Take 5 mLs (200 mg total) by mouth daily.  240 mL  3  . metoprolol tartrate (LOPRESSOR) 25 MG tablet Take 25 mg by mouth 2 (two) times daily.      . mirtazapine (REMERON) 15 MG tablet Take 0.5 tablets (7.5 mg total) by mouth at bedtime.  30 tablet  2  . Multiple Vitamins-Minerals (CENTRUM SILVER PO) Take by mouth daily.      . pantoprazole (PROTONIX) 40 MG tablet Take 40 mg by mouth daily.      . pravastatin (PRAVACHOL) 40 MG tablet Take 1 tablet (40 mg total) by mouth daily.  90 tablet  3  . promethazine (PHENERGAN) 25 MG tablet Take 25 mg by mouth every 6 (six) hours as needed.      . senna-docusate (SENOKOT-S) 8.6-50 MG per tablet Take 1 tablet by mouth daily.      . sevelamer (RENVELA) 800  MG tablet Take 800 mg by mouth 3 (three) times daily with meals. Take 2 pills 3 times a day      . simvastatin (ZOCOR) 20 MG tablet Take 1 tablet (20 mg total) by mouth every evening.  90 tablet  1   No current facility-administered medications on file prior to visit.     Past Medical History  Diagnosis Date  . 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 Nephrolithiasis     obstructive nephrolithiasis  . Nephrolithiasis     obstructive nephrolithiasis with renal failure  . Renal cell carcinoma July 2011    s/p left heminephrectomy [facility]  . Prostate cancer 2002    s/p prostatectomy  .  Closed internal injury of right kidney     secondnary to ureteral obstruction  . Chronic kidney disease (CKD), stage III (moderate) 2011    atrophic right kidney, left  heminephrectomy  . Rib fracture 2007    secondary to trauma, ninth left  . Diverticulosis of colon   . H/O renal calculi   . Hyperlipidemia   . Hypertension      Past Surgical History  Procedure Laterality Date  . Heminephrectomy  July 2011    [facility]; renal cell  [place]  . Prostatectomy  2002     Family History  Problem Relation Age of Onset  . Hyperlipidemia Mother   . Hypertension Mother   . Atrial fibrillation Mother   . Heart disease Father   . Hypertension Sister   . Hyperlipidemia Sister      History   Social History  . Marital Status: Single    Spouse Name: N/A    Number of Children: N/A  . Years of Education: N/A   Occupational History  . Retired  [employer]   Social History Main Topics  . Smoking status: Current Some Day Smoker    Types: Pipe  . Smokeless tobacco: Not on file     Comment: Quit smoking cigarettes many years ago   . Alcohol Use: No  . Drug Use: Not on file  . Sexual Activity: Not on file   Other Topics Concern  . Not on file   Social History Narrative   Retired from [employer] at [employer], also works with the [employer] system      ROS   PHYSICAL EXAM   There were no vitals taken for this visit.   EKG:   ASSESSMENT AND PLAN

## 2013-06-03 ENCOUNTER — Other Ambulatory Visit: Payer: Self-pay | Admitting: Internal Medicine

## 2013-06-03 LAB — HEMOGLOBIN: HGB: 8.2 g/dL — ABNORMAL LOW (ref 13.0–18.0)

## 2013-06-04 ENCOUNTER — Ambulatory Visit: Payer: Medicare Other | Admitting: Internal Medicine

## 2013-06-04 ENCOUNTER — Inpatient Hospital Stay: Payer: Self-pay | Admitting: Internal Medicine

## 2013-06-04 LAB — CBC WITH DIFFERENTIAL/PLATELET
Basophil #: 0.1 10*3/uL (ref 0.0–0.1)
Basophil %: 1.5 %
Eosinophil #: 0.3 10*3/uL (ref 0.0–0.7)
Eosinophil %: 6.8 %
HCT: 18 % — ABNORMAL LOW (ref 40.0–52.0)
Lymphocyte #: 0.9 10*3/uL — ABNORMAL LOW (ref 1.0–3.6)
Lymphocyte %: 17.3 %
MCH: 32 pg (ref 26.0–34.0)
Monocyte #: 0.7 x10 3/mm (ref 0.2–1.0)
Neutrophil #: 3.1 10*3/uL (ref 1.4–6.5)
Platelet: 252 10*3/uL (ref 150–440)
RBC: 1.92 10*6/uL — ABNORMAL LOW (ref 4.40–5.90)
RDW: 15.1 % — ABNORMAL HIGH (ref 11.5–14.5)
WBC: 5.1 10*3/uL (ref 3.8–10.6)

## 2013-06-04 LAB — COMPREHENSIVE METABOLIC PANEL
Alkaline Phosphatase: 40 U/L — ABNORMAL LOW
Anion Gap: 10 (ref 7–16)
BUN: 50 mg/dL — ABNORMAL HIGH (ref 7–18)
Chloride: 100 mmol/L (ref 98–107)
Creatinine: 14.33 mg/dL — ABNORMAL HIGH (ref 0.60–1.30)
EGFR (African American): 4 — ABNORMAL LOW
EGFR (Non-African Amer.): 3 — ABNORMAL LOW
Osmolality: 294 (ref 275–301)
Potassium: 4.8 mmol/L (ref 3.5–5.1)
Sodium: 140 mmol/L (ref 136–145)

## 2013-06-04 LAB — BODY FLUID CELL COUNT WITH DIFFERENTIAL
Neutrophils: 3 %
Nucleated Cell Count: 23 /mm3
Other Cells BF: 0 %
Other Mononuclear Cells: 79 %

## 2013-06-04 LAB — TROPONIN I: Troponin-I: <0.02 ng/mL

## 2013-06-04 LAB — ETHANOL: Ethanol %: <0.003 % (ref 0.000–0.080)

## 2013-06-04 LAB — HEMOGLOBIN: HGB: 7.2 g/dL — ABNORMAL LOW (ref 13.0–18.0)

## 2013-06-04 LAB — LIPASE, BLOOD: Lipase: 153 U/L (ref 73–393)

## 2013-06-04 LAB — MAGNESIUM: Magnesium: 2.9 mg/dL — ABNORMAL HIGH

## 2013-06-05 LAB — CBC WITH DIFFERENTIAL/PLATELET
Basophil #: 0.1 10*3/uL (ref 0.0–0.1)
HCT: 16.5 % — ABNORMAL LOW (ref 40.0–52.0)
HGB: 6 g/dL — ABNORMAL LOW (ref 13.0–18.0)
MCH: 32.7 pg (ref 26.0–34.0)
MCHC: 36.3 g/dL — ABNORMAL HIGH (ref 32.0–36.0)
MCV: 90 fL (ref 80–100)
Monocyte #: 0.8 x10 3/mm (ref 0.2–1.0)
Neutrophil #: 3.8 10*3/uL (ref 1.4–6.5)
Platelet: 177 10*3/uL (ref 150–440)
RDW: 15.5 % — ABNORMAL HIGH (ref 11.5–14.5)
WBC: 5.9 10*3/uL (ref 3.8–10.6)

## 2013-06-05 LAB — BASIC METABOLIC PANEL
Anion Gap: 6 — ABNORMAL LOW (ref 7–16)
BUN: 52 mg/dL — ABNORMAL HIGH (ref 7–18)
Calcium, Total: 7.8 mg/dL — ABNORMAL LOW (ref 8.5–10.1)
Co2: 31 mmol/L (ref 21–32)
Creatinine: 14.25 mg/dL — ABNORMAL HIGH (ref 0.60–1.30)
EGFR (African American): 4 — ABNORMAL LOW
EGFR (Non-African Amer.): 3 — ABNORMAL LOW
Osmolality: 294 (ref 275–301)
Potassium: 5.4 mmol/L — ABNORMAL HIGH (ref 3.5–5.1)
Sodium: 140 mmol/L (ref 136–145)

## 2013-06-05 LAB — HEMOGLOBIN
HGB: 5.9 g/dL — ABNORMAL LOW (ref 13.0–18.0)
HGB: 6.9 g/dL — ABNORMAL LOW (ref 13.0–18.0)

## 2013-06-06 LAB — HEMOGLOBIN: HGB: 9 g/dL — ABNORMAL LOW (ref 13.0–18.0)

## 2013-06-06 LAB — CBC WITH DIFFERENTIAL/PLATELET
Basophil #: 0 10*3/uL (ref 0.0–0.1)
Eosinophil #: 0.3 10*3/uL (ref 0.0–0.7)
HCT: 20.5 % — ABNORMAL LOW (ref 40.0–52.0)
Lymphocyte #: 0.7 10*3/uL — ABNORMAL LOW (ref 1.0–3.6)
MCH: 31.3 pg (ref 26.0–34.0)
MCHC: 35 g/dL (ref 32.0–36.0)
MCV: 90 fL (ref 80–100)
Platelet: 161 10*3/uL (ref 150–440)
RDW: 15.2 % — ABNORMAL HIGH (ref 11.5–14.5)

## 2013-06-06 LAB — BASIC METABOLIC PANEL
BUN: 52 mg/dL — ABNORMAL HIGH (ref 7–18)
Calcium, Total: 7.7 mg/dL — ABNORMAL LOW (ref 8.5–10.1)
Chloride: 100 mmol/L (ref 98–107)
Co2: 28 mmol/L (ref 21–32)
Creatinine: 13.86 mg/dL — ABNORMAL HIGH (ref 0.60–1.30)
Glucose: 103 mg/dL — ABNORMAL HIGH (ref 65–99)
Osmolality: 288 (ref 275–301)
Potassium: 4.3 mmol/L (ref 3.5–5.1)
Sodium: 137 mmol/L (ref 136–145)

## 2013-06-09 ENCOUNTER — Inpatient Hospital Stay: Payer: Self-pay | Admitting: Internal Medicine

## 2013-06-09 LAB — COMPREHENSIVE METABOLIC PANEL
Albumin: 2.3 g/dL — ABNORMAL LOW (ref 3.4–5.0)
Alkaline Phosphatase: 46 U/L
Anion Gap: 9 (ref 7–16)
Bilirubin,Total: 0.3 mg/dL (ref 0.2–1.0)
Chloride: 101 mmol/L (ref 98–107)
Co2: 30 mmol/L (ref 21–32)
EGFR (Non-African Amer.): 3 — ABNORMAL LOW
Glucose: 105 mg/dL — ABNORMAL HIGH (ref 65–99)
Osmolality: 293 (ref 275–301)
Potassium: 4.5 mmol/L (ref 3.5–5.1)
SGOT(AST): 16 U/L (ref 15–37)
SGPT (ALT): 12 U/L (ref 12–78)
Sodium: 140 mmol/L (ref 136–145)
Total Protein: 5.4 g/dL — ABNORMAL LOW (ref 6.4–8.2)

## 2013-06-09 LAB — CBC
HCT: 24.8 % — ABNORMAL LOW (ref 40.0–52.0)
HGB: 8 g/dL — ABNORMAL LOW (ref 13.0–18.0)
MCHC: 32.1 g/dL (ref 32.0–36.0)
MCV: 92 fL (ref 80–100)
Platelet: 250 10*3/uL (ref 150–440)
RDW: 14.8 % — ABNORMAL HIGH (ref 11.5–14.5)

## 2013-06-09 LAB — CULTURE, BLOOD (SINGLE)

## 2013-06-10 LAB — CBC WITH DIFFERENTIAL/PLATELET
Basophil #: 0.1 10*3/uL (ref 0.0–0.1)
Basophil #: 0.1 10*3/uL (ref 0.0–0.1)
Eosinophil %: 7.4 %
HCT: 21.7 % — ABNORMAL LOW (ref 40.0–52.0)
HGB: 7.2 g/dL — ABNORMAL LOW (ref 13.0–18.0)
Lymphocyte %: 16.8 %
MCH: 30.4 pg (ref 26.0–34.0)
MCH: 30.7 pg (ref 26.0–34.0)
MCV: 91 fL (ref 80–100)
MCV: 92 fL (ref 80–100)
Monocyte #: 0.8 x10 3/mm (ref 0.2–1.0)
Monocyte %: 16.7 %
Neutrophil #: 3.1 10*3/uL (ref 1.4–6.5)
Neutrophil #: 3.1 10*3/uL (ref 1.4–6.5)
Platelet: 196 10*3/uL (ref 150–440)
RBC: 2.37 10*6/uL — ABNORMAL LOW (ref 4.40–5.90)
RDW: 14.7 % — ABNORMAL HIGH (ref 11.5–14.5)
RDW: 15.2 % — ABNORMAL HIGH (ref 11.5–14.5)
WBC: 5.3 10*3/uL (ref 3.8–10.6)
WBC: 5.3 10*3/uL (ref 3.8–10.6)

## 2013-06-10 LAB — HEMOGLOBIN: HGB: 8.2 g/dL — ABNORMAL LOW (ref 13.0–18.0)

## 2013-06-11 LAB — HEMOGLOBIN: HGB: 7.2 g/dL — ABNORMAL LOW (ref 13.0–18.0)

## 2013-06-11 LAB — PHOSPHORUS: Phosphorus: 6.2 mg/dL — ABNORMAL HIGH (ref 2.5–4.9)

## 2013-06-12 LAB — HEMOGLOBIN: HGB: 7.5 g/dL — ABNORMAL LOW (ref 13.0–18.0)

## 2013-07-04 ENCOUNTER — Ambulatory Visit: Payer: Medicare Other | Admitting: Internal Medicine

## 2013-07-16 ENCOUNTER — Encounter: Payer: Self-pay | Admitting: Cardiovascular Disease

## 2013-09-04 ENCOUNTER — Ambulatory Visit: Payer: Medicare Other | Admitting: Cardiovascular Disease

## 2013-09-15 ENCOUNTER — Ambulatory Visit: Payer: Medicare Other | Admitting: Cardiovascular Disease

## 2013-10-09 ENCOUNTER — Encounter: Payer: Self-pay | Admitting: Cardiovascular Disease

## 2013-10-09 ENCOUNTER — Ambulatory Visit (INDEPENDENT_AMBULATORY_CARE_PROVIDER_SITE_OTHER): Payer: Medicare Other | Admitting: Cardiovascular Disease

## 2013-10-09 VITALS — BP 127/89 | HR 107 | Ht 71.0 in | Wt 138.5 lb

## 2013-10-09 DIAGNOSIS — I10 Essential (primary) hypertension: Secondary | ICD-10-CM

## 2013-10-09 DIAGNOSIS — I4891 Unspecified atrial fibrillation: Secondary | ICD-10-CM

## 2013-10-09 DIAGNOSIS — I5032 Chronic diastolic (congestive) heart failure: Secondary | ICD-10-CM

## 2013-10-09 MED ORDER — METOPROLOL TARTRATE 25 MG PO TABS: 25.0000 mg | ORAL_TABLET | Freq: Two times a day (BID) | ORAL | Status: AC

## 2013-10-09 NOTE — Assessment & Plan Note (Signed)
I resumed metoprolol and thus I stopped amlodipine.

## 2013-10-09 NOTE — Assessment & Plan Note (Signed)
He is maintaining in sinus rhythm with amiodarone. He has a resting tachycardia. He used to be on metoprolol but ran out.  I resumed metoprolol at 25 mg twice daily. He is not a candidate for anticoagulation due to severe anemia requiring transfusion and Epogen. Check liver and thyroid panel with his labs given that he is on amiodarone.

## 2013-10-09 NOTE — Assessment & Plan Note (Signed)
He appears to be euvolemic. 

## 2013-10-09 NOTE — Patient Instructions (Signed)
Your physician has recommended you make the following change in your medication:  Stop Amlodipine  Start Metoprolol 25 mg twice a day    Your physician recommends that you return for lab work in:  Please have TSH and Liver profile done at dialysis Please have dialysis fax all your labs to [phone]  Your physician wants you to follow-up in: 6 months. You will receive a reminder letter in the mail two months in advance. If you don't receive a letter, please call our office to schedule the follow-up appointment.

## 2013-10-09 NOTE — Progress Notes (Signed)
HPI  This is a 68 year old man who is here today for a followup visit regarding chronic diastolic heart failure and atrial fibrillation. He has known history of end-stage renal disease on peritoneal dialysis, recurrent nephrolithiasis, hypertension, hyperlipidemia, renal cell carcinoma, tobacco use and anemia of chronic kidney disease. He presented to [facility] in November of 2014 with increased dyspnea and weight gain with fluid retention. He was noted to be in atrial fibrillation with rapid ventricular response. He had previous atrial fibrillation in October of 2013 when he was hospitalized at [facility] after complications of ureteral stenting.   The patient converted to normal sinus rhythm with amiodarone. He had an echocardiogram done during his hospitalization which showed normal LV systolic function. He is not on anticoagulation due to severe anemia. His wife reports that he was diagnosed with kidney cancer recently and underwent 6 rounds of chemotherapy at [facility]. He required blood transfusion do to worsening anemia. He denies chest pain, dyspnea or palpitations.    Allergies  Allergen Reactions  . Contrast Media [Iodinated Diagnostic Agents]   . Morphine And Related      Current Outpatient Prescriptions on File Prior to Visit  Medication Sig Dispense Refill  . amiodarone (PACERONE) 200 MG tablet Take 1 tablet (200 mg total) by mouth daily.  30 tablet  6  . losartan (COZAAR) 100 MG tablet Take 100 mg by mouth daily.      . megestrol (MEGACE) 400 MG/10ML suspension Take 5 mLs (200 mg total) by mouth daily.  240 mL  3  . sevelamer (RENVELA) 800 MG tablet Takes 3 tablets TID before meals and 2 tablets before snacks.      . simvastatin (ZOCOR) 20 MG tablet Take 1 tablet (20 mg total) by mouth every evening.  90 tablet  1   No current facility-administered medications on file prior to visit.     Past Medical History  Diagnosis Date  . Rib fracture 2007    secondary to trauma, ninth left    . Diverticulosis of colon   . H/O renal calculi   . Hyperlipidemia   . Hypertension   . Prostate cancer 2002    s/p prostatectomy  . Persistent atrial fibrillation   . 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 Nephrolithiasis     obstructive nephrolithiasis  . Nephrolithiasis     obstructive nephrolithiasis with renal failure  . Closed internal injury of right kidney     secondnary to ureteral obstruction  . End-stage renal disease on peritoneal dialysis 2011    atrophic right kidney, left  heminephrectomy  . Renal cell carcinoma July 2011    s/p left heminephrectomy [facility]  . Chronic diastolic heart failure      Past Surgical History  Procedure Laterality Date  . Heminephrectomy  July 2011    [facility]; renal cell  [place]  . Prostatectomy  2002     Family History  Problem Relation Age of Onset  . Hyperlipidemia Mother   . Hypertension Mother   . Atrial fibrillation Mother   . Heart disease Father   . Hypertension Sister   . Hyperlipidemia Sister      History   Social History  . Marital Status: Single    Spouse Name: N/A    Number of Children: N/A  . Years of Education: N/A   Occupational History  . Retired  [employer]   Social History Main Topics  . Smoking status: Former Smoker --  0 years    Types: Pipe  . Smokeless tobacco: Not on file     Comment: Quit smoking cigarettes many years ago   . Alcohol Use: No  . Drug Use: No  . Sexual Activity: Not on file   Other Topics Concern  . Not on file   Social History Narrative   Retired from [employer] at [employer], also works with the [employer]   BP 127/89  Pulse 107  Ht 5\' 11"  (1.803 m)  Wt 138 lb 8 oz (62.823 kg)  BMI 19.33 kg/m2 Constitutional: He is oriented to person, place, and time. He appears well-developed and well-nourished. No distress.  HENT: No nasal discharge.  Head: Normocephalic and  atraumatic.  Eyes: Pupils are equal and round.  No discharge. Neck: Normal range of motion. Neck supple. No JVD present. No thyromegaly present.  Cardiovascular: Normal rate, regular rhythm, normal heart sounds. Exam reveals no gallop and no friction rub. No murmur heard.  Pulmonary/Chest: Effort normal and breath sounds normal. No stridor. No respiratory distress. He has no wheezes. He has no rales. He exhibits no tenderness.  Abdominal: Soft. Bowel sounds are normal. He exhibits no distension. There is no tenderness. There is no rebound and no guarding.  Musculoskeletal: Normal range of motion. He exhibits no edema and no tenderness.  Neurological: He is alert and oriented to person, place, and time. Coordination normal.  Skin: Skin is warm and dry. No rash noted. He is not diaphoretic. No erythema. No pallor.  Psychiatric: He has a flat affect . His behavior is normal. Judgment and thought content normal.       EYC:XKGYJ tachycardia -  Nonspecific T-abnormality.   ABNORMAL    ASSESSMENT AND PLAN

## 2013-12-01 ENCOUNTER — Ambulatory Visit: Payer: Self-pay | Admitting: Internal Medicine

## 2013-12-02 ENCOUNTER — Ambulatory Visit: Payer: Self-pay | Admitting: Family Medicine

## 2013-12-02 LAB — COMPREHENSIVE METABOLIC PANEL
ALK PHOS: 80 U/L
ANION GAP: 18 — AB (ref 7–16)
AST: 17 U/L (ref 15–37)
Albumin: 2.3 g/dL — ABNORMAL LOW (ref 3.4–5.0)
BILIRUBIN TOTAL: 0.4 mg/dL (ref 0.2–1.0)
BUN: 61 mg/dL — AB (ref 7–18)
CALCIUM: 8.7 mg/dL (ref 8.5–10.1)
CREATININE: 13.05 mg/dL — AB (ref 0.60–1.30)
Chloride: 97 mmol/L — ABNORMAL LOW (ref 98–107)
Co2: 24 mmol/L (ref 21–32)
EGFR (African American): 4 — ABNORMAL LOW
EGFR (Non-African Amer.): 3 — ABNORMAL LOW
GLUCOSE: 98 mg/dL (ref 65–99)
Osmolality: 295 (ref 275–301)
Potassium: 4.9 mmol/L (ref 3.5–5.1)
SGPT (ALT): 23 U/L (ref 12–78)
Sodium: 139 mmol/L (ref 136–145)
TOTAL PROTEIN: 6.4 g/dL (ref 6.4–8.2)

## 2013-12-02 LAB — CBC WITH DIFFERENTIAL/PLATELET
BASOS PCT: 1.1 %
Basophil #: 0.1 10*3/uL (ref 0.0–0.1)
Eosinophil #: 0.4 10*3/uL (ref 0.0–0.7)
Eosinophil %: 4.7 %
HCT: 33.7 % — AB (ref 40.0–52.0)
HGB: 10.6 g/dL — ABNORMAL LOW (ref 13.0–18.0)
Lymphocyte #: 1.2 10*3/uL (ref 1.0–3.6)
Lymphocyte %: 14.1 %
MCH: 30.6 pg (ref 26.0–34.0)
MCHC: 31.4 g/dL — ABNORMAL LOW (ref 32.0–36.0)
MCV: 98 fL (ref 80–100)
Monocyte #: 0.8 x10 3/mm (ref 0.2–1.0)
Monocyte %: 10.3 %
Neutrophil #: 5.7 10*3/uL (ref 1.4–6.5)
Neutrophil %: 69.8 %
Platelet: 312 10*3/uL (ref 150–440)
RBC: 3.45 10*6/uL — ABNORMAL LOW (ref 4.40–5.90)
RDW: 17.1 % — ABNORMAL HIGH (ref 11.5–14.5)
WBC: 8.2 10*3/uL (ref 3.8–10.6)

## 2013-12-02 LAB — PHOSPHORUS: Phosphorus: 9.8 mg/dL — ABNORMAL HIGH (ref 2.5–4.9)

## 2013-12-02 LAB — MAGNESIUM: Magnesium: 3 mg/dL — ABNORMAL HIGH

## 2013-12-11 LAB — COMPREHENSIVE METABOLIC PANEL
ALBUMIN: 2.1 g/dL — AB (ref 3.4–5.0)
ALK PHOS: 71 U/L
ALT: 21 U/L (ref 12–78)
AST: 19 U/L (ref 15–37)
Anion Gap: 8 (ref 7–16)
BUN: 42 mg/dL — ABNORMAL HIGH (ref 7–18)
Bilirubin,Total: 0.2 mg/dL (ref 0.2–1.0)
Calcium, Total: 8.7 mg/dL (ref 8.5–10.1)
Chloride: 100 mmol/L (ref 98–107)
Co2: 31 mmol/L (ref 21–32)
Creatinine: 11.62 mg/dL — ABNORMAL HIGH (ref 0.60–1.30)
EGFR (Non-African Amer.): 4 — ABNORMAL LOW
GFR CALC AF AMER: 5 — AB
Glucose: 111 mg/dL — ABNORMAL HIGH (ref 65–99)
OSMOLALITY: 289 (ref 275–301)
Potassium: 3.7 mmol/L (ref 3.5–5.1)
SODIUM: 139 mmol/L (ref 136–145)
TOTAL PROTEIN: 6 g/dL — AB (ref 6.4–8.2)

## 2013-12-11 LAB — BODY FLUID CELL COUNT WITH DIFFERENTIAL
BASOS ABS: 0 %
EOS PCT: 2 %
Lymphocytes: 36 %
NEUTROS PCT: 26 %
Nucleated Cell Count: 34 /mm3
OTHER CELLS BF: 0 %
Other Mononuclear Cells: 36 %

## 2013-12-11 LAB — CBC WITH DIFFERENTIAL/PLATELET
BASOS PCT: 0.9 %
Basophil #: 0.1 10*3/uL (ref 0.0–0.1)
Eosinophil #: 0.3 10*3/uL (ref 0.0–0.7)
Eosinophil %: 4 %
HCT: 32.9 % — AB (ref 40.0–52.0)
HGB: 10.5 g/dL — AB (ref 13.0–18.0)
Lymphocyte #: 0.6 10*3/uL — ABNORMAL LOW (ref 1.0–3.6)
Lymphocyte %: 7.4 %
MCH: 31 pg (ref 26.0–34.0)
MCHC: 31.9 g/dL — AB (ref 32.0–36.0)
MCV: 97 fL (ref 80–100)
Monocyte #: 0.7 x10 3/mm (ref 0.2–1.0)
Monocyte %: 8.8 %
Neutrophil #: 6.1 10*3/uL (ref 1.4–6.5)
Neutrophil %: 78.9 %
PLATELETS: 258 10*3/uL (ref 150–440)
RBC: 3.39 10*6/uL — ABNORMAL LOW (ref 4.40–5.90)
RDW: 16.7 % — AB (ref 11.5–14.5)
WBC: 7.7 10*3/uL (ref 3.8–10.6)

## 2013-12-12 ENCOUNTER — Inpatient Hospital Stay: Payer: Self-pay | Admitting: Internal Medicine

## 2013-12-12 LAB — BASIC METABOLIC PANEL
Anion Gap: 9 (ref 7–16)
BUN: 48 mg/dL — AB (ref 7–18)
CO2: 30 mmol/L (ref 21–32)
Calcium, Total: 8 mg/dL — ABNORMAL LOW (ref 8.5–10.1)
Chloride: 97 mmol/L — ABNORMAL LOW (ref 98–107)
Creatinine: 11.95 mg/dL — ABNORMAL HIGH (ref 0.60–1.30)
EGFR (African American): 4 — ABNORMAL LOW
EGFR (Non-African Amer.): 4 — ABNORMAL LOW
Glucose: 100 mg/dL — ABNORMAL HIGH (ref 65–99)
OSMOLALITY: 285 (ref 275–301)
POTASSIUM: 3.9 mmol/L (ref 3.5–5.1)
Sodium: 136 mmol/L (ref 136–145)

## 2013-12-12 LAB — CBC WITH DIFFERENTIAL/PLATELET
BASOS ABS: 0.1 10*3/uL (ref 0.0–0.1)
Basophil %: 0.7 %
EOS PCT: 4.4 %
Eosinophil #: 0.3 10*3/uL (ref 0.0–0.7)
HCT: 28.5 % — AB (ref 40.0–52.0)
HGB: 9.3 g/dL — AB (ref 13.0–18.0)
LYMPHS PCT: 11 %
Lymphocyte #: 0.9 10*3/uL — ABNORMAL LOW (ref 1.0–3.6)
MCH: 31.6 pg (ref 26.0–34.0)
MCHC: 32.8 g/dL (ref 32.0–36.0)
MCV: 97 fL (ref 80–100)
Monocyte #: 0.9 x10 3/mm (ref 0.2–1.0)
Monocyte %: 12 %
Neutrophil #: 5.6 10*3/uL (ref 1.4–6.5)
Neutrophil %: 71.9 %
Platelet: 212 10*3/uL (ref 150–440)
RBC: 2.95 10*6/uL — ABNORMAL LOW (ref 4.40–5.90)
RDW: 16.6 % — AB (ref 11.5–14.5)
WBC: 7.8 10*3/uL (ref 3.8–10.6)

## 2013-12-12 LAB — MAGNESIUM: Magnesium: 2.5 mg/dL — ABNORMAL HIGH

## 2013-12-13 LAB — BASIC METABOLIC PANEL
Anion Gap: 12 (ref 7–16)
BUN: 57 mg/dL — ABNORMAL HIGH (ref 7–18)
CALCIUM: 8.1 mg/dL — AB (ref 8.5–10.1)
CO2: 26 mmol/L (ref 21–32)
CREATININE: 13.88 mg/dL — AB (ref 0.60–1.30)
Chloride: 100 mmol/L (ref 98–107)
EGFR (African American): 4 — ABNORMAL LOW
EGFR (Non-African Amer.): 3 — ABNORMAL LOW
GLUCOSE: 96 mg/dL (ref 65–99)
OSMOLALITY: 291 (ref 275–301)
POTASSIUM: 4.8 mmol/L (ref 3.5–5.1)
Sodium: 138 mmol/L (ref 136–145)

## 2013-12-13 LAB — CBC WITH DIFFERENTIAL/PLATELET
BASOS PCT: 0.5 %
Basophil #: 0 10*3/uL (ref 0.0–0.1)
EOS PCT: 4.4 %
Eosinophil #: 0.3 10*3/uL (ref 0.0–0.7)
HCT: 29.1 % — AB (ref 40.0–52.0)
HGB: 9.5 g/dL — ABNORMAL LOW (ref 13.0–18.0)
LYMPHS PCT: 11.3 %
Lymphocyte #: 0.9 10*3/uL — ABNORMAL LOW (ref 1.0–3.6)
MCH: 31.1 pg (ref 26.0–34.0)
MCHC: 32.5 g/dL (ref 32.0–36.0)
MCV: 96 fL (ref 80–100)
MONO ABS: 1 x10 3/mm (ref 0.2–1.0)
Monocyte %: 12.4 %
NEUTROS PCT: 71.4 %
Neutrophil #: 5.6 10*3/uL (ref 1.4–6.5)
Platelet: 225 10*3/uL (ref 150–440)
RBC: 3.05 10*6/uL — AB (ref 4.40–5.90)
RDW: 16.6 % — ABNORMAL HIGH (ref 11.5–14.5)
WBC: 7.9 10*3/uL (ref 3.8–10.6)

## 2013-12-13 LAB — PHOSPHORUS: Phosphorus: 7.7 mg/dL — ABNORMAL HIGH (ref 2.5–4.9)

## 2013-12-15 LAB — BODY FLUID CULTURE

## 2013-12-15 LAB — CANCER ANTIGEN 19-9

## 2013-12-15 LAB — PSA

## 2013-12-16 LAB — HEMOGLOBIN: HGB: 10.2 g/dL — ABNORMAL LOW (ref 13.0–18.0)

## 2013-12-16 LAB — PHOSPHORUS: Phosphorus: 11.9 mg/dL — ABNORMAL HIGH (ref 2.5–4.9)

## 2013-12-17 LAB — HEMOGLOBIN: HGB: 7.5 g/dL — ABNORMAL LOW (ref 13.0–18.0)

## 2013-12-18 DIAGNOSIS — I5032 Chronic diastolic (congestive) heart failure: Secondary | ICD-10-CM

## 2013-12-18 DIAGNOSIS — I959 Hypotension, unspecified: Secondary | ICD-10-CM

## 2013-12-18 DIAGNOSIS — I4891 Unspecified atrial fibrillation: Secondary | ICD-10-CM

## 2013-12-18 DIAGNOSIS — N186 End stage renal disease: Secondary | ICD-10-CM

## 2013-12-18 LAB — CBC WITH DIFFERENTIAL/PLATELET
BASOS PCT: 0.4 %
Basophil #: 0 10*3/uL (ref 0.0–0.1)
EOS PCT: 2.9 %
Eosinophil #: 0.3 10*3/uL (ref 0.0–0.7)
HCT: 22.8 % — ABNORMAL LOW (ref 40.0–52.0)
HGB: 7.1 g/dL — AB (ref 13.0–18.0)
LYMPHS ABS: 0.9 10*3/uL — AB (ref 1.0–3.6)
LYMPHS PCT: 8 %
MCH: 30 pg (ref 26.0–34.0)
MCHC: 31.2 g/dL — AB (ref 32.0–36.0)
MCV: 96 fL (ref 80–100)
MONOS PCT: 15.9 %
Monocyte #: 1.8 x10 3/mm — ABNORMAL HIGH (ref 0.2–1.0)
Neutrophil #: 8 10*3/uL — ABNORMAL HIGH (ref 1.4–6.5)
Neutrophil %: 72.8 %
PLATELETS: 293 10*3/uL (ref 150–440)
RBC: 2.37 10*6/uL — AB (ref 4.40–5.90)
RDW: 16.3 % — ABNORMAL HIGH (ref 11.5–14.5)
WBC: 11 10*3/uL — AB (ref 3.8–10.6)

## 2013-12-18 LAB — POTASSIUM: Potassium: 4.3 mmol/L (ref 3.5–5.1)

## 2013-12-18 LAB — TSH: THYROID STIMULATING HORM: 1.75 u[IU]/mL

## 2013-12-18 LAB — COMPREHENSIVE METABOLIC PANEL
ALBUMIN: 1.6 g/dL — AB (ref 3.4–5.0)
Alkaline Phosphatase: 124 U/L — ABNORMAL HIGH
Anion Gap: 14 (ref 7–16)
BUN: 36 mg/dL — ABNORMAL HIGH (ref 7–18)
Bilirubin,Total: 0.4 mg/dL (ref 0.2–1.0)
CALCIUM: 8.5 mg/dL (ref 8.5–10.1)
CREATININE: 8.53 mg/dL — AB (ref 0.60–1.30)
Chloride: 97 mmol/L — ABNORMAL LOW (ref 98–107)
Co2: 26 mmol/L (ref 21–32)
EGFR (Non-African Amer.): 6 — ABNORMAL LOW
GFR CALC AF AMER: 7 — AB
Glucose: 93 mg/dL (ref 65–99)
Osmolality: 282 (ref 275–301)
POTASSIUM: 4.2 mmol/L (ref 3.5–5.1)
SGOT(AST): 28 U/L (ref 15–37)
SGPT (ALT): 14 U/L (ref 12–78)
Sodium: 137 mmol/L (ref 136–145)
TOTAL PROTEIN: 6.3 g/dL — AB (ref 6.4–8.2)

## 2013-12-18 LAB — RENAL FUNCTION PANEL
Albumin: 1.4 g/dL — ABNORMAL LOW (ref 3.4–5.0)
Anion Gap: 9 (ref 7–16)
BUN: 70 mg/dL — AB (ref 7–18)
CALCIUM: 8.2 mg/dL — AB (ref 8.5–10.1)
CHLORIDE: 100 mmol/L (ref 98–107)
CREATININE: 13.57 mg/dL — AB (ref 0.60–1.30)
Co2: 27 mmol/L (ref 21–32)
EGFR (African American): 4 — ABNORMAL LOW
EGFR (Non-African Amer.): 3 — ABNORMAL LOW
GLUCOSE: 90 mg/dL (ref 65–99)
Osmolality: 292 (ref 275–301)
PHOSPHORUS: 8.9 mg/dL — AB (ref 2.5–4.9)
Potassium: 6.1 mmol/L — ABNORMAL HIGH (ref 3.5–5.1)
Sodium: 136 mmol/L (ref 136–145)

## 2013-12-18 LAB — TROPONIN I: TROPONIN-I: 0.05 ng/mL

## 2013-12-18 LAB — HEMOGLOBIN
HGB: 7.2 g/dL — ABNORMAL LOW (ref 13.0–18.0)
HGB: 10.5 g/dL — ABNORMAL LOW (ref 13.0–18.0)
HGB: 9.3 g/dL — ABNORMAL LOW (ref 13.0–18.0)

## 2013-12-19 LAB — CBC WITH DIFFERENTIAL/PLATELET
Basophil #: 0 10*3/uL (ref 0.0–0.1)
Basophil %: 0.4 %
EOS ABS: 0.2 10*3/uL (ref 0.0–0.7)
Eosinophil %: 2.5 %
HCT: 24.9 % — ABNORMAL LOW (ref 40.0–52.0)
HGB: 8.2 g/dL — ABNORMAL LOW (ref 13.0–18.0)
Lymphocyte #: 0.4 10*3/uL — ABNORMAL LOW (ref 1.0–3.6)
Lymphocyte %: 4.3 %
MCH: 30 pg (ref 26.0–34.0)
MCHC: 32.9 g/dL (ref 32.0–36.0)
MCV: 91 fL (ref 80–100)
Monocyte #: 1.3 x10 3/mm — ABNORMAL HIGH (ref 0.2–1.0)
Monocyte %: 14.6 %
NEUTROS ABS: 7.2 10*3/uL — AB (ref 1.4–6.5)
Neutrophil %: 78.2 %
PLATELETS: 238 10*3/uL (ref 150–440)
RBC: 2.73 10*6/uL — ABNORMAL LOW (ref 4.40–5.90)
RDW: 18.2 % — AB (ref 11.5–14.5)
WBC: 9.2 10*3/uL (ref 3.8–10.6)

## 2013-12-19 LAB — TROPONIN I
TROPONIN-I: 0.06 ng/mL — AB
Troponin-I: 0.05 ng/mL

## 2013-12-20 LAB — HEMOGLOBIN
HGB: 7.2 g/dL — ABNORMAL LOW (ref 13.0–18.0)
HGB: 8.9 g/dL — ABNORMAL LOW (ref 13.0–18.0)

## 2013-12-21 LAB — HEMOGLOBIN: HGB: 9.1 g/dL — AB (ref 13.0–18.0)

## 2013-12-22 LAB — CBC WITH DIFFERENTIAL/PLATELET
BASOS PCT: 0.2 %
Basophil #: 0 10*3/uL (ref 0.0–0.1)
Eosinophil #: 0.2 10*3/uL (ref 0.0–0.7)
Eosinophil %: 1.5 %
HCT: 32 % — ABNORMAL LOW (ref 40.0–52.0)
HGB: 9.8 g/dL — ABNORMAL LOW (ref 13.0–18.0)
LYMPHS ABS: 0.5 10*3/uL — AB (ref 1.0–3.6)
Lymphocyte %: 5.1 %
MCH: 29 pg (ref 26.0–34.0)
MCHC: 30.5 g/dL — ABNORMAL LOW (ref 32.0–36.0)
MCV: 95 fL (ref 80–100)
Monocyte #: 1.8 x10 3/mm — ABNORMAL HIGH (ref 0.2–1.0)
Monocyte %: 17.5 %
NEUTROS ABS: 8 10*3/uL — AB (ref 1.4–6.5)
Neutrophil %: 75.7 %
Platelet: 313 10*3/uL (ref 150–440)
RBC: 3.37 10*6/uL — ABNORMAL LOW (ref 4.40–5.90)
RDW: 18.2 % — ABNORMAL HIGH (ref 11.5–14.5)
WBC: 10.5 10*3/uL (ref 3.8–10.6)

## 2013-12-22 LAB — RENAL FUNCTION PANEL
Albumin: 1.5 g/dL — ABNORMAL LOW (ref 3.4–5.0)
Anion Gap: 8 (ref 7–16)
BUN: 64 mg/dL — ABNORMAL HIGH (ref 7–18)
CALCIUM: 9.4 mg/dL (ref 8.5–10.1)
CHLORIDE: 99 mmol/L (ref 98–107)
CREATININE: 9.42 mg/dL — AB (ref 0.60–1.30)
Co2: 28 mmol/L (ref 21–32)
EGFR (Non-African Amer.): 5 — ABNORMAL LOW
GFR CALC AF AMER: 6 — AB
GLUCOSE: 95 mg/dL (ref 65–99)
Osmolality: 288 (ref 275–301)
Phosphorus: 6.8 mg/dL — ABNORMAL HIGH (ref 2.5–4.9)
Potassium: 5.5 mmol/L — ABNORMAL HIGH (ref 3.5–5.1)
SODIUM: 135 mmol/L — AB (ref 136–145)

## 2013-12-22 LAB — HEMOGLOBIN: HGB: 9.9 g/dL — ABNORMAL LOW (ref 13.0–18.0)

## 2014-01-19 ENCOUNTER — Telehealth: Payer: Self-pay | Admitting: Internal Medicine

## 2014-01-31 DEATH — deceased

## 2014-08-25 IMAGING — CT CT ABD-PELV W/ CM
2 of 5 series · 14 of 46 positions shown, 16 images · IV contrast (isovue)
Comparison: 04/13/2012

ADDENDUM:
These results were called by telephone at the time of interpretation
on 12/11/2013 at [DATE] to Dr. Lakshman , who verbally
acknowledged these results.
CLINICAL DATA: Abdomen pain with blood in peritoneal dialysate

EXAM:
CT ABDOMEN AND PELVIS WITH CONTRAST
TECHNIQUE: Multidetector CT imaging of the abdomen and pelvis was performed
using the standard protocol following bolus administration of
intravenous contrast.
CONTRAST:  80 mL Isovue 300

[Series 2: routine abd pel with · axial · 0.74mm/px · z∈[-964,-554]mm · 11 of 92 slices shown, 13 images]
[im 5/92  soft-tissue]
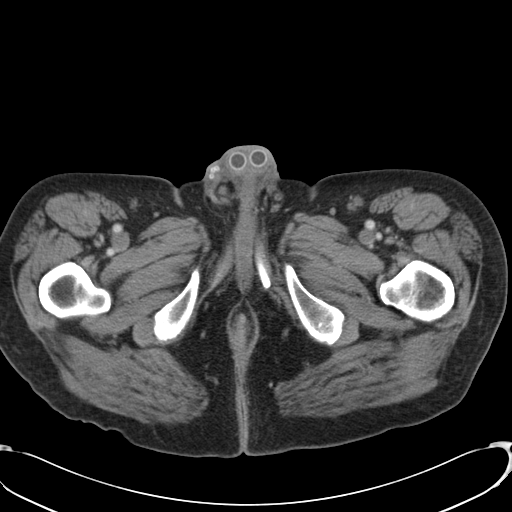
[im 5/92  bone]
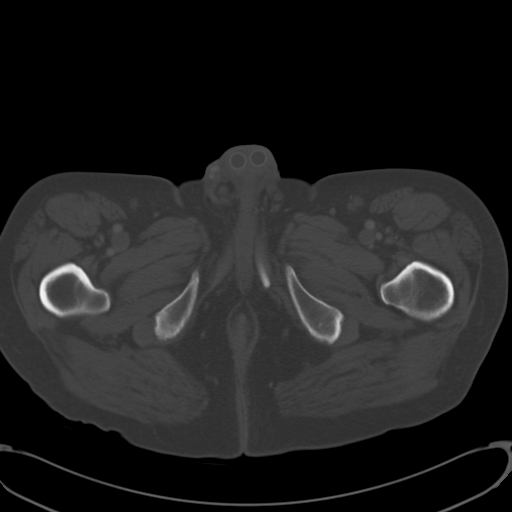
[im 15/92  soft-tissue]
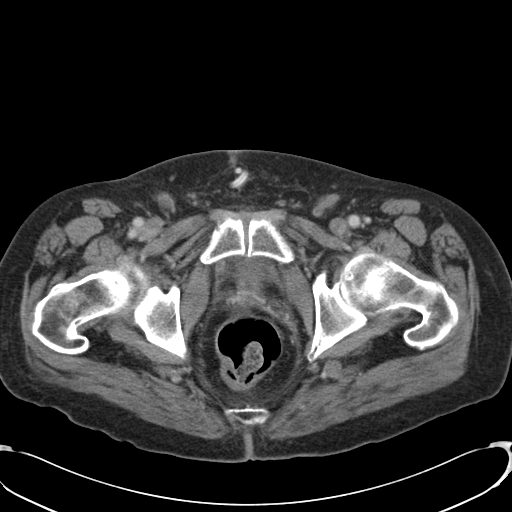
[im 24/92  soft-tissue]
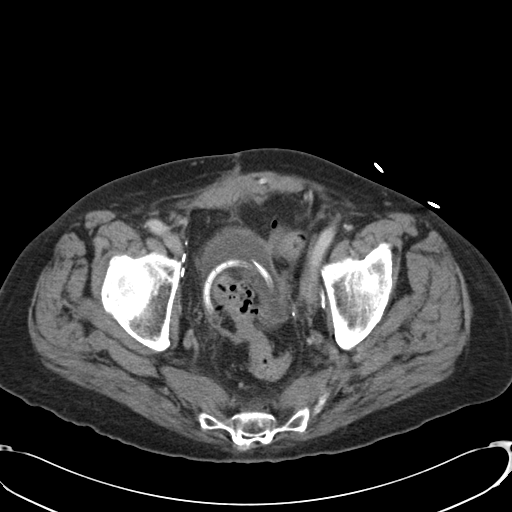
[im 29/92  soft-tissue]
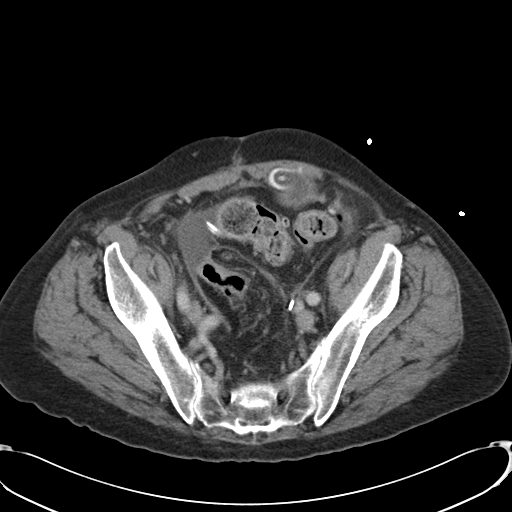
[im 39/92  soft-tissue]
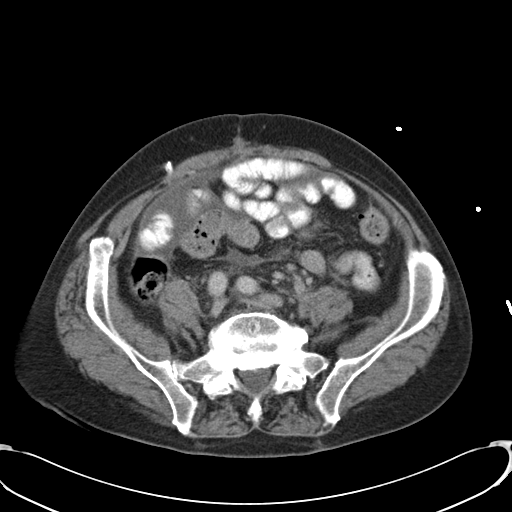
[im 48/92  soft-tissue]
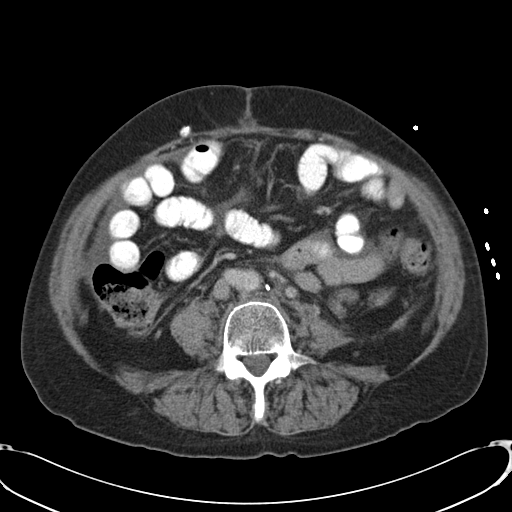
[im 53/92  soft-tissue]
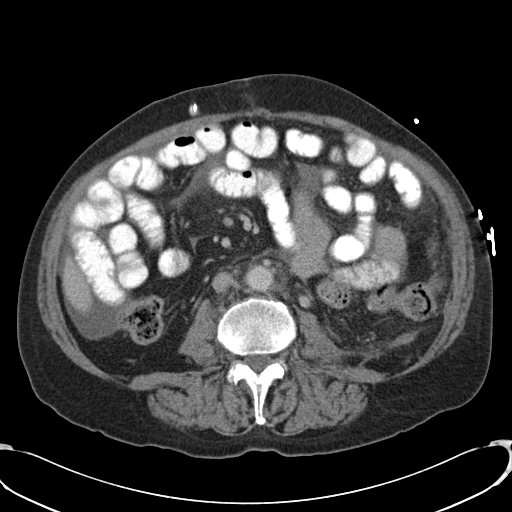
[im 63/92  soft-tissue]
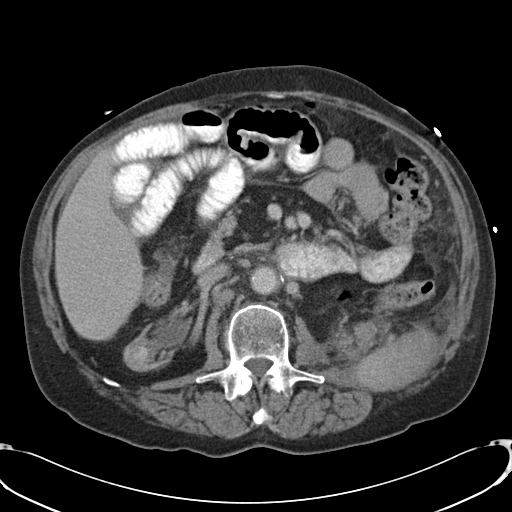
[im 68/92  soft-tissue]
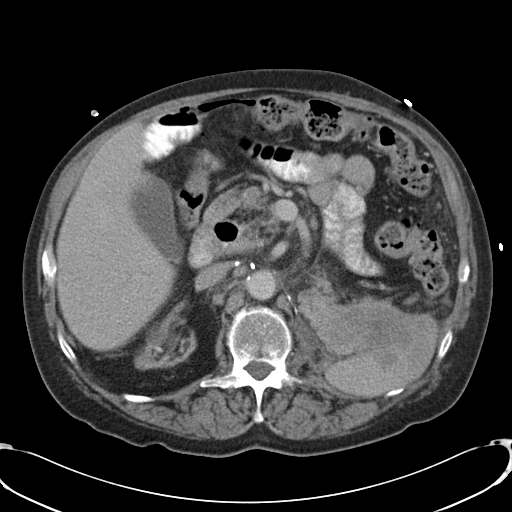
[im 68/92  bone]
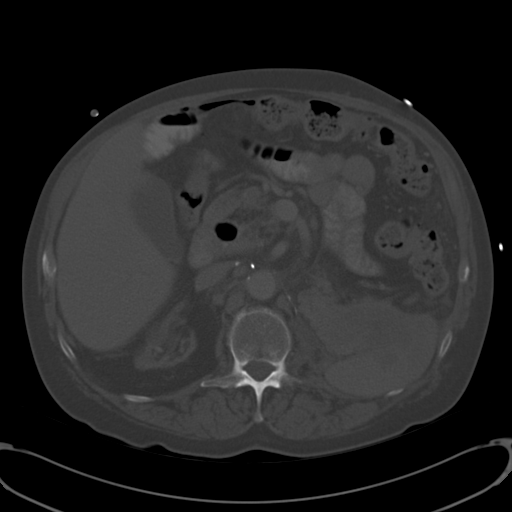
[im 77/92  soft-tissue]
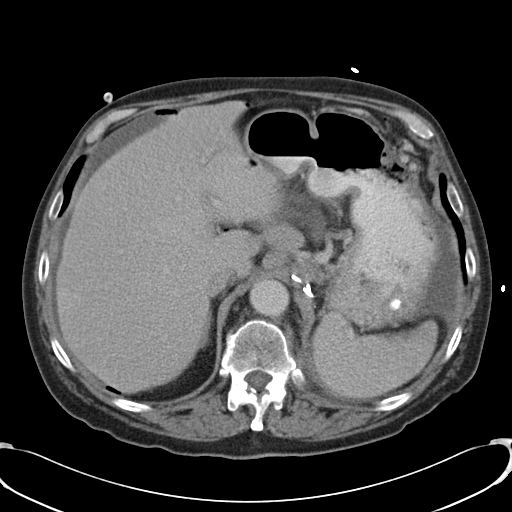
[im 87/92  soft-tissue]
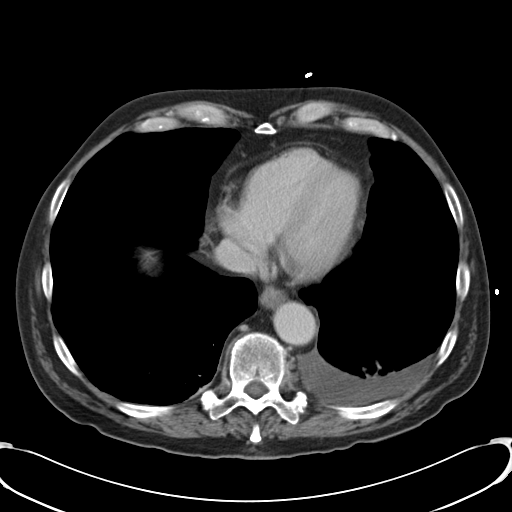

[Series 5: cor routine abd pel with · coronal · 0.63mm/px · 3 of 134 slices shown]
[im 45/134  soft-tissue]
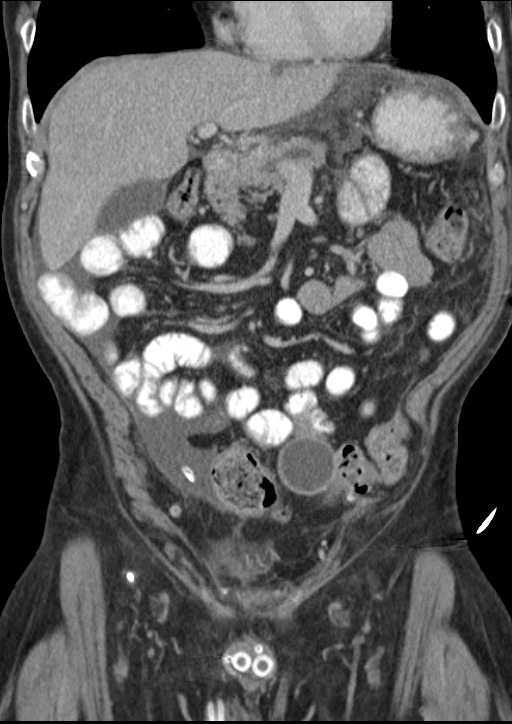
[im 60/134  soft-tissue]
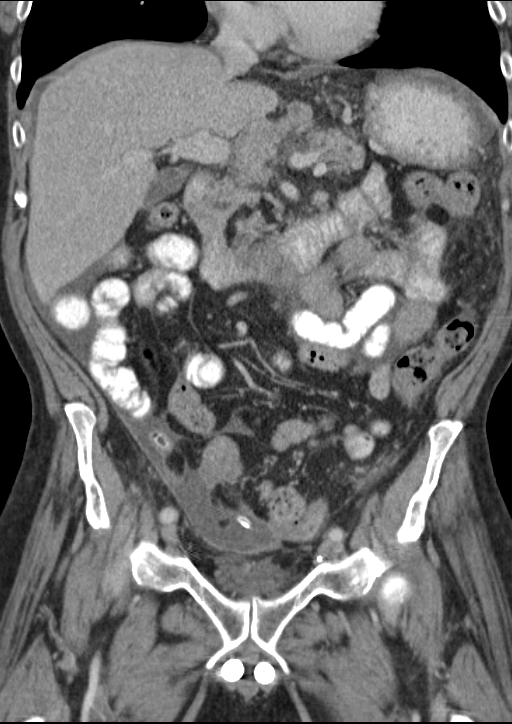
[im 74/134  soft-tissue]
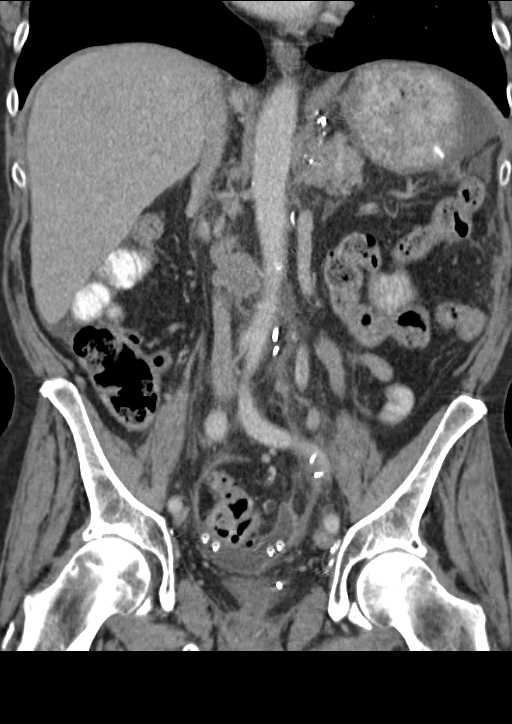

[14 of 46 positions shown; findings below may reference images not displayed]

FINDINGS: The lung bases demonstrate some emphysematous change. A small
left-sided pleural effusion is noted.

The liver is well visualized and again demonstrates a cystic lesion
within the left lobe. The gallbladder is well distended without
cholelithiasis. The left kidney is been surgically removed. The
right kidney is small and shrunken with calculi similar to that seen
on the prior exam and consistent with the patient's clinical history
of end-stage renal disease. The right adrenal gland is unremarkable.
The left adrenal gland is not well visualized.

The pancreas is well visualized and shows pancreatic ductal
dilatation which extends towards the head. Some irregular
enhancement is noted in the head of the pancreas best seen on image
number 61 of series 5 and coronal reconstructions. Additionally in
the tail of the pancreas there is an irregular peripherally
enhancing mass lesion which appears to be growing into the spleen.
It measures 6.9 x 4.9 cm in greatest transverse and AP dimensions
respectively. This was not present on the prior exam and is highly
suspicious for pancreatic neoplasm. With the changes in the head of
the pancreas and dilatation of the pancreatic duct this is
suspicious for multifocal pancreatic lesions. Adjacent to the body
of the pancreas there is a 2.9 x 1.8 cm soft tissue lesion likely
representing lymphadenopathy. This is best seen on image number 17
of series 2 and image 68 of series 5. It is adjacent to the previous
surgical clips from the left nephrectomy. Adenopathy is also noted
adjacent to the portacaval space best seen on image number 20
measuring 3.4 x 1.6 cm. Smaller periaortic and a retrocaval and
intra-aortic caval lymph nodes are noted. The largest of these lies
posterior to the inferior vena cava and measures 2.8 x 1.9 cm.

Some increased density is noted within the mesenteric and omental
fat in the left hemi abdomen. It is uncertain whether this is
related to the patient's known peritoneal dialysis treatment or
related to some localized omental deposits. None of these are
significant size wise. Free fluid and air is noted within the
abdomen consistent with the history of peritoneal dialysis.

Scanning into the pelvis demonstrates the peritoneal dialysis
catheter to be in satisfactory position. Surrounding fluid is noted
consistent with the recent therapy. Changes consistent with a penile
prosthesis are noted. Diverticular change is noted without evidence
of diverticulitis. The bladder is decompressed consistent with the
clinical history. Degenerative changes of the spine are seen.
IMPRESSION: Changes consistent with the given clinical history of peritoneal
dialysis with free air and free fluid within the abdomen.

Small left pleural effusion.

Changes consistent with pancreatic neoplasm with local invasion into
the hilum of the spleen superiorly as well as multifocal areas of
lymphadenopathy in and changes suspicious for peritoneal deposits. A
second suspicious areas noted in the region of the head of the
pancreas with dilatation of the pancreatic duct although it is
incompletely evaluated on this exam. MRI may be helpful for further
evaluation as necessary. Biopsy of this area may also be helpful.

Changes consistent with prior left nephrectomy and chronic renal
disease.

## 2014-09-05 IMAGING — CR DG CHEST 2V
1 series · 3 of 3 positions shown · non-contrast
Comparison: Chest x-rays dated 12/18/2013 and 12/11/2012

CLINICAL DATA: Shortness of breath.

EXAM:
CHEST  2 VIEW

[Series 1: x chest ap · 0.14mm/px · 3 of 3 slices shown]
[im 1/3]
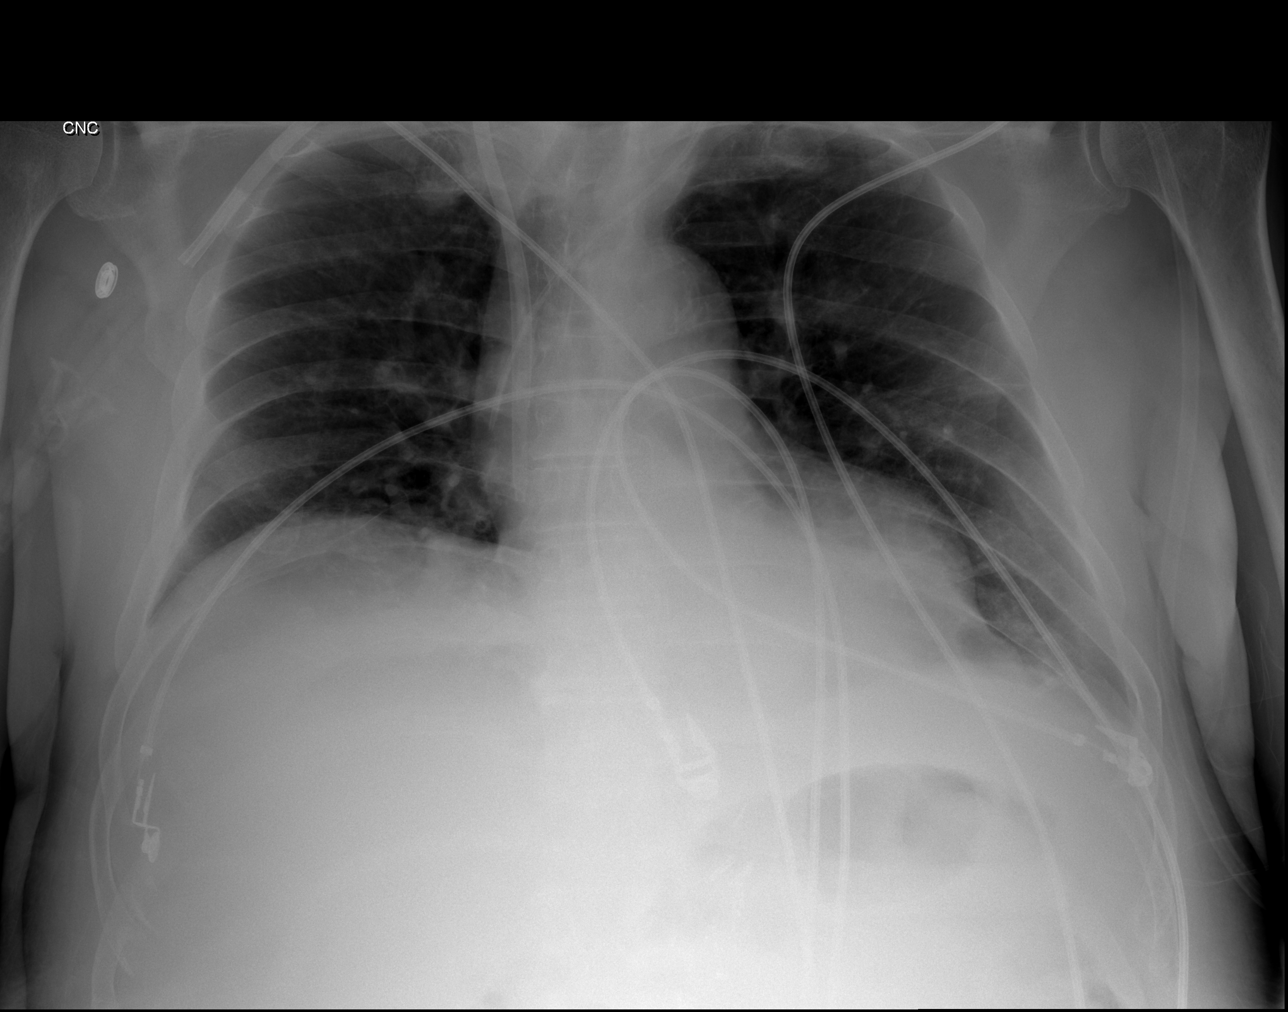
[im 2/3]
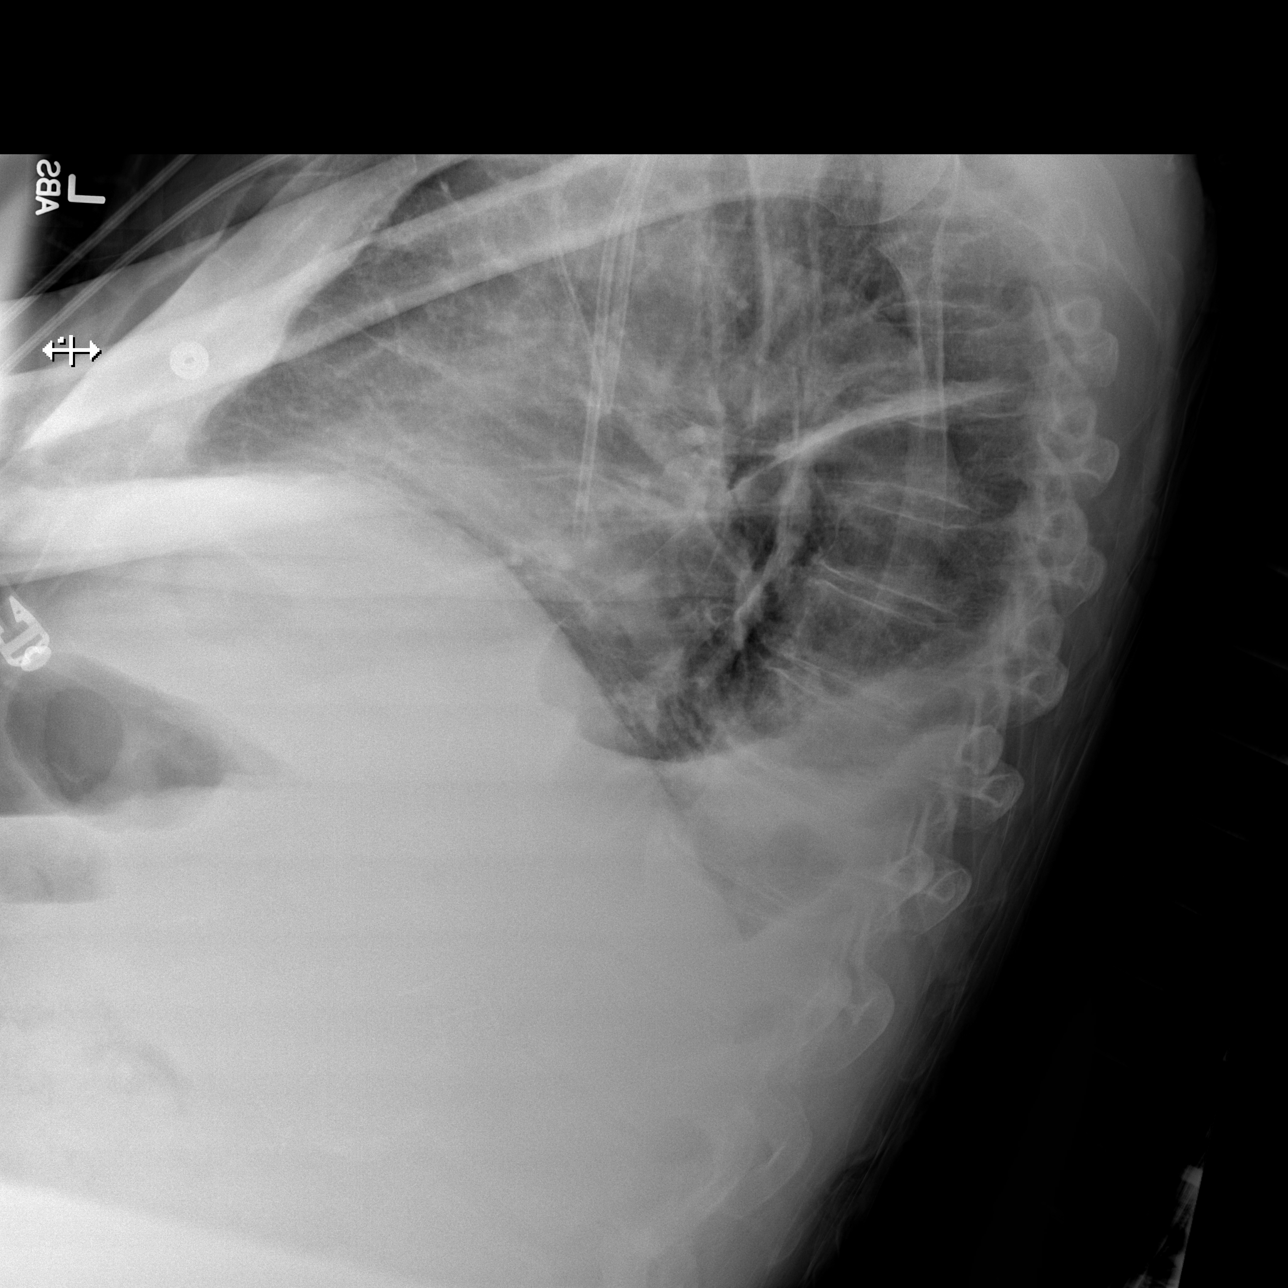
[im 3/3]
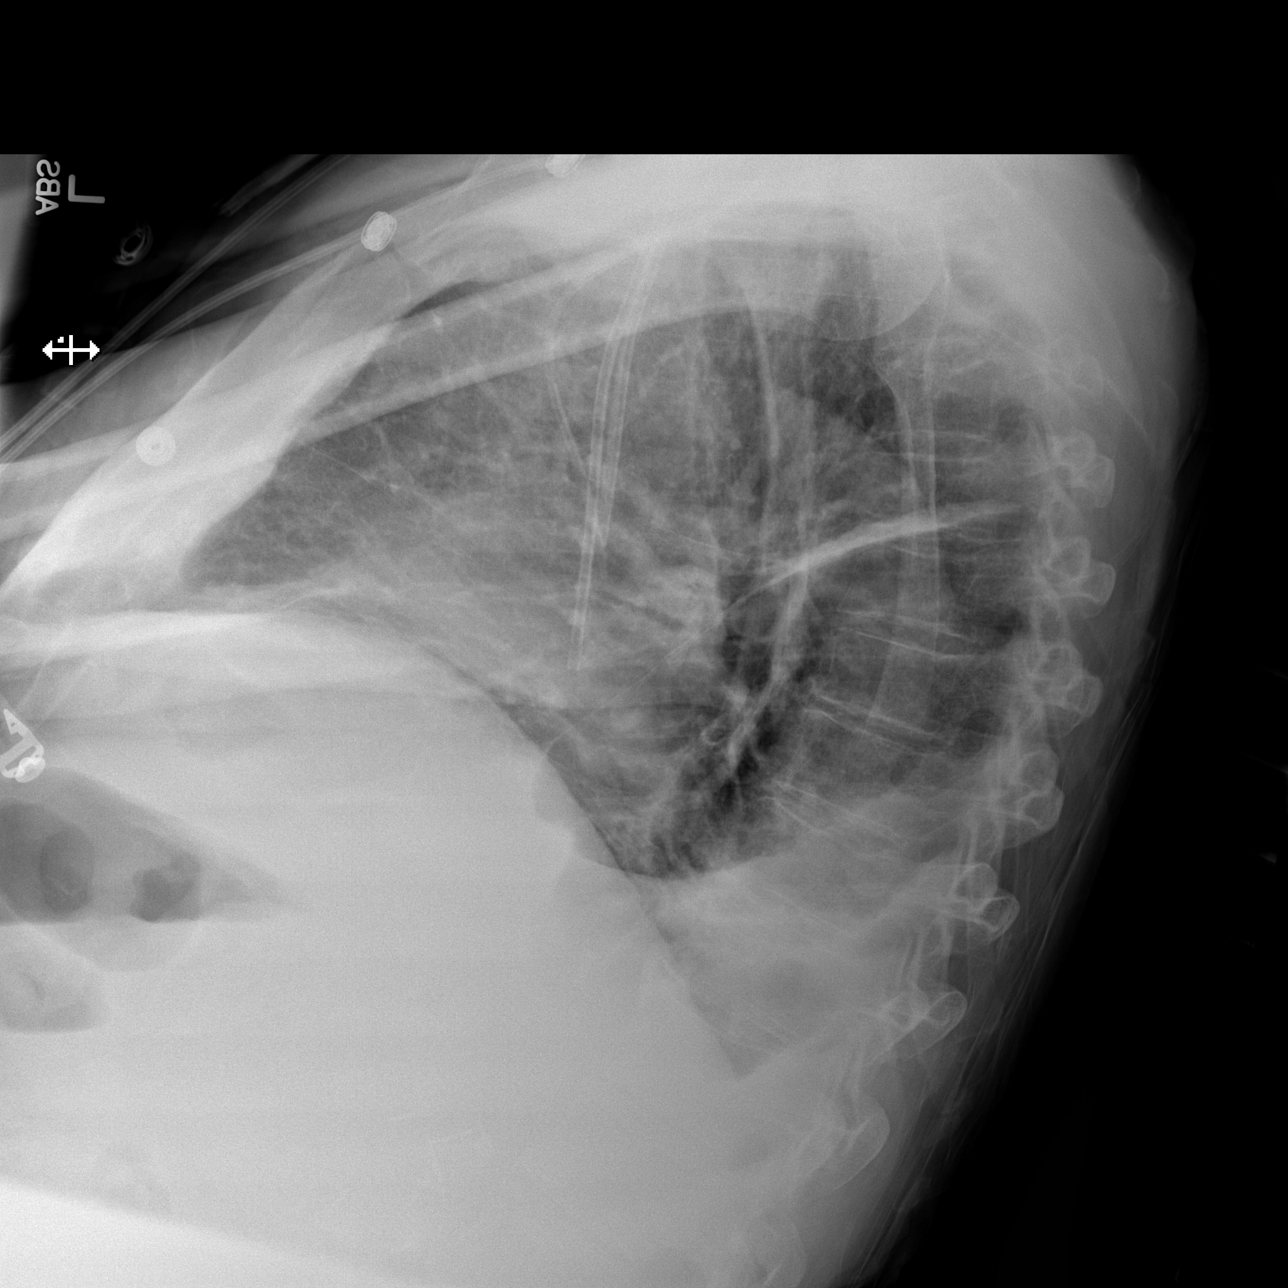

[3 of 3 positions shown; findings below may reference images not displayed]

FINDINGS: There small bilateral pleural effusions, slightly larger on the left
than the right. Heart size and pulmonary vascularity are normal. No
infiltrates. Double lumen dialysis catheter in place with the tips
at the cavoatrial junction. Left central venous catheter in place
with the tip in the superior vena cava in good position.

No osseous abnormality.
IMPRESSION: Small bilateral pleural effusions.

## 2014-10-20 NOTE — Op Note (Signed)
PATIENT NAME:  David Mueller, David Mueller MR#:  [id number] DATE OF BIRTH:  [birth year]  DATE OF PROCEDURE:  05/21/2012  PREOPERATIVE DIAGNOSIS: Complication of AV catheter with poor flow.   POSTOPERATIVE DIAGNOSIS: Complication of AV catheter with poor flow.   PROCEDURE: Three-hour TPA infusion of right IJ cuffed tunneled dialysis catheter.   SURGEON: Hortencia Pilar, M.D.   DESCRIPTION OF PROCEDURE: The patient is brought to special procedures holding area. Two aliquots of TPA, 2 mg in 50 mL, are reconstituted and simultaneous infusion through both lumens of the tunneled catheter is performed over a three hour period.   At the conclusion, I personally aspirated both lumens which aspirate quite easily. I then flushed the catheter and packed both lumens with 1 mg of TPA per lumen.   The patient tolerated the procedure well. There were no immediate complications.  ____________________________ Katha Cabal, MD ggs:slb D: 05/21/2012 16:56:03 ET T: 05/22/2012 09:54:46 ET JOB#: [id number]  cc: Katha Cabal, MD, <Dictator> Katha Cabal MD ELECTRONICALLY SIGNED 06/19/2012 16:22

## 2014-10-23 NOTE — Consult Note (Signed)
I have seen and examined Mr. Dahms and agree with Tobe Sos a/p.   No rectal bleeding since 6 am this morning.  Hgb was down to 6.1.  Last colon many years ago.   Plan:  clears tonight and tomorrow morning.  Miralax prep tomorrow morning. npo after 10 am.   Colonoscopy tomorrow afternoon.   - cont to monitor h/h, hemodynamics  Electronic Signatures: Arther Dames (MD)  (Signed on 03-Dec-14 20:15)  Authored  Last Updated: 03-Dec-14 20:15 by Arther Dames (MD)

## 2014-10-23 NOTE — Op Note (Signed)
PATIENT NAME:  JEREN, DUFRANE MR#:  [id number] DATE OF BIRTH:  1946-02-03  DATE OF PROCEDURE:  09/30/2012  PREOPERATIVE DIAGNOSIS: End-stage renal disease with functioning peritoneal dialysis catheter and no longer needing PermCath.  POSTOPERATIVE DIAGNOSIS: End-stage renal disease with functioning peritoneal dialysis catheter and no longer needing PermCath.  PROCEDURES PERFORMED: Removal of right jugular PermCath.  SURGEONS: Melvyn Neth, P.A.-C, and Dr. Lucky Cowboy.  ANESTHESIA: Local.  ESTIMATED BLOOD LOSS: Minimal.  INDICATIONS FOR THE PROCEDURE: The patient is a 69 year old white male with end-stage renal disease. He has been currently using a peritoneal dialysis catheter without issues. He no longer needs his PermCath, therefore it will be removed.  DESCRIPTION OF THE PROCEDURE: The patient was brought into the vascular interventional radiology area and positioned supine. The right neck and chest and existing catheter were sterilely prepped and draped and a sterile surgical field was created. The area was locally anesthetized copiously with 1% lidocaine. Hemostats were used to help dissect out the cuff, and an 11-blade was used to transect the fibrous sheath connected to the cuff. The catheter was then removed in its entirety without difficulty with gentle traction. Pressure was held at the base of the neck. A sterile dressing was placed. The patient tolerated the procedure well. No complications.     ____________________________ Marin Shutter Teauna Dubach, PA-C cnh:dm D: 09/30/2012 13:53:00 ET T: 09/30/2012 14:20:51 ET JOB#: [id number]  cc: Marin Shutter. Alyss Granato, PA-C, <Dictator> [place] PA ELECTRONICALLY SIGNED 10/24/2012 8:55

## 2014-10-23 NOTE — Op Note (Signed)
PATIENT NAME:  David Mueller, David Mueller MR#:  [id number] DATE OF BIRTH:  March 07, 1946  DATE OF PROCEDURE:  09/03/2012  PREOPERATIVE DIAGNOSES:   1.  End-stage renal disease requiring hemodialysis.  2.  Hypertension.  3.  Hypercholesterolemia.   POSTOPERATIVE DIAGNOSES:   1.  End-stage renal disease requiring hemodialysis.  2.  Hypertension.  3.  Hypercholesterolemia.  4.  Abdominal wall mass in left lower quadrant.   PROCEDURE PERFORMED:  Laparoscopic-assisted insertion of peritoneal dialysis catheter.   SURGEON:  Katha Cabal, MD  INTRAOPERATIVE CONSULTATION:  Glena Norfolk. Lundquist, MD  ANESTHESIA:  General by endotracheal intubation.   FLUIDS:  Per anesthesia record.   ESTIMATED BLOOD LOSS:  Minimal.   SPECIMEN:  None.   INDICATIONS:  The patient is a 69 year old gentleman who has been initiated on hemodialysis and now wishes to transfer to peritoneal. He is therefore undergoing insertion of peritoneal catheter. Risks and benefits were reviewed. All questions answered. The patient agrees to proceed.   DESCRIPTION OF PROCEDURE:  The patient is taken to the operating room and placed in the supine position. After adequate general anesthesia is induced and appropriate invasive monitors are placed, he is positioned supine and prepped and draped from just below the nipple line to the groins. Appropriate timeout is called.   A curvilinear incision made along the inferior margin of the umbilicus and the dissection is carried down to expose the fascia which is hooked with a bone hook, elevated and Veress needle is infused without difficulty. Saline test is positive and a low flow insufflation of CO2 was initiated. High flow is then performed and the peritoneal cavity is insufflated to 15 mmHg. A 10 mm trocar is placed and the camera is introduced. Visual inspection of the viscera demonstrates a mass on the anterior margin of the abdominal wall in the left lower quadrant. Correlation seems to be  with a previous trocar site. It is smooth and nonpulsatile and looks to be a poorly reabsorbed hematoma; however, true characterization is somewhat uncertain.   The remaining inspection of the abdominal contents appears quite normal. There are really no adhesions from his previous prostate surgery and therefore it is elected to proceed with insertion of the peritoneal catheter, but from a right lower quadrant approach.   A curvilinear incision is made above the waist line and carried down through the soft tissues to expose the fascia. A pursestring suture of 0 Vicryl is then placed and a Seldinger needle is inserted through the center of the pursestring suture. J-wire is then advanced followed with the dilator and peel-away sheath. Pigtail peritoneal catheter is then advanced through the peel-away sheath. The peel-away sheath is removed and under laparoscopic visualization, the catheter is positioned deep down into the pelvis in the midline. A small incision is made in the fascia and the cuff is introduced into the subfascial plane and then secured with a pursestring of 0 Vicryl. The catheter is then tunneled superiorly and pulled through the subcutaneous tissues with a grasper.   Gas is then expelled from the abdomen. A pursestring suture of 0 Vicryl is used to repair the fascial defect for the 10 mm trocar. Saline is instilled through the catheter and then returned quite easily and readily indicating good catheter function, this is after the hub has been connected. Both incisions are then closed in layers using 3-0 Vicryl in a running fashion followed by 4-0 Monocryl subcuticular and then Dermabond.   The patient tolerated the procedure well and there  were no immediate complications. Sponge and needle counts are correct and he is taken to the recovery area in excellent condition.    ____________________________ Katha Cabal, MD ggs:si D: 09/03/2012 19:35:09 ET T: 09/03/2012 20:29:23  ET JOB#: [id number]  cc: Katha Cabal, MD, <Dictator> Katha Cabal MD ELECTRONICALLY SIGNED 10/01/2012 17:19

## 2014-10-23 NOTE — Discharge Summary (Signed)
PATIENT NAME:  David Mueller, David Mueller MR#:  [id number] DATE OF BIRTH:  04/26/1946  DATE OF ADMISSION:  05/23/2013 DATE OF DISCHARGE:  05/26/2013  ADMITTING PHYSICIAN:  Dr. Vivien Presto.  DISCHARGING PHYSICIAN:  Dr. Gladstone Lighter.   PRIMARY CARE PHYSICIAN: Dr. Derrel Nip.  CONSULTATIONS IN THE HOSPITAL:  1.  Nephrology consultation by Dr. Holley Raring and Dr. Candiss Norse.  2.  Cardiology consultation by Dr. Fletcher Anon.   DISCHARGE DIAGNOSES: 1.  Acute respiratory failure.  2.  Acute pulmonary edema.  3.  End-stage renal disease on peritoneal dialysis.  4.  Diastolic congestive heart failure with ejection fraction of 50% to 55%.  5.  Paroxysmal atrial fibrillation with rapid ventricular response in the hospital.  6.  Acute anemia.  7.  Anemia of chronic disease.  8.  Hypertension.  9.  History of left renal mass, status left nephrectomy for malignant mass.  10.  Prostate cancer history, status post prostatectomy.  11.  Lumbosacral disk repair via laser ablation.  12.  Hyperlipidemia.   DISCHARGE HOME MEDICATIONS:  1.  Renvela 800 mg 1 tablet p.o. 3 times a day and 2 tablets with snacks.  2.   Amlodipine 5 mg p.o. daily.  3.  Simvastatin 20 mg p.o. at bedtime.  4.  Multivitamin 1 tablet p.o. daily.  5.  Megace 40 mg/mL oral suspension - 20 mL once a day.  6.  Losartan 100 mg p.o. daily.  7.  Amiodarone 400 mg p.o. b.i.d.  8.  Aspirin 81 mg p.o. daily.  9.  Metoprolol 75 mg p.o. b.i.d.   DISCHARGE DIET: Low-sodium and renal diet.   DISCHARGE ACTIVITY: As tolerated.     FOLLOWUP INSTRUCTIONS: 1.  Follow up with Dr. Fletcher Anon in 5 to 6 days.  2.  Nephrology followup in 1 week.  3.  PCP followup in 2 weeks.   LABS AND IMAGING STUDIES PRIOR TO DISCHARGE: WBC 6.6, hemoglobin 10.1, hematocrit 20.7, platelet count 187.   Sodium 141, potassium 4.0, chloride 100, bicarb 34, BUN 51, creatinine 12.6, glucose 110 and calcium of 8.3. Chest x-ray on November 22nd showing improvement in bilateral pulmonary edema.  This was after diuresis. Subsegmental atelectasis noted in left lung base. Echocardiogram showing EF of 50% to 55%, low-normal global left ventricular systolic function and impaired relaxation, indicative of impaired LV diastolic function.   HbA1c is less than 3.5. Magnesium is 2.5.   LDL 51, HDL 47, total cholesterol 107, triglycerides 43.    BNP was elevated at 93,927 on admission.   BRIEF HOSPITAL COURSE: David Mueller is a 69 year old male with past medical history significant for end-stage renal disease on peritoneal dialysis status post left nephrectomy for left renal cell carcinoma, history of hypertension, diastolic CHF, presents to the hospital secondary to worsening breathing.   1.  Acute respiratory failure was significantly hypoxic and tachypneic on admission and was started on BiPAP. Chest x-ray revealed severe pulmonary edema. The patient had dialysis done for the last 4 days with extra fluid removal. That improved his breathing. He is on room air with sats greater than 90% at this time. Echo showed diastolic dysfunction. The likely cause for his pulmonary edema could have been renal failure with fluid retention. His EF is 50% to 55%. 2.  Atrial fibrillation with rapid ventricular response. The patient had a history of paroxysmal Afib in the past and was treated on amiodarone at some point in [facility]. When he came in, he was only on metoprolol 25 b.i.d. Likely the pulmonary  edema could have triggered him into Afib. The patient was in CCU on amiodarone drip, which converted him to normal sinus rhythm; however, the patient had trouble with slipping back and forth within normal sinus rhythm and Afib while in the hospital. He was seen by cardiologist, Dr. Fletcher Anon, who changed the drip to p.o. amiodarone at this time. He is on 400 p.o. b.i.d. He will take it for 7 days and then will follow up with cardiology to taper off the dose. His metoprolol was increased to 75 mg b.i.d. at this  time. The patient ambulated prior to discharge. He remained in normal sinus rhythm with no further increase in heart rate and is being discharged home.  3.  Hypertension. His metoprolol was increased to 75 b.i.d. and his losartan was increased to 100 daily prior to discharge.  4.  Acute on chronic anemia. The patient's hemoglobin has dropped down to 7, and with his Afib with RVR, he did receive 1 unit of RBC transfusion. Likely has underlying anemia of chronic disease.  5.  End-stage renal disease on peritoneal dialysis. Follow up with nephrology as an outpatient.   His course has been otherwise uneventful in the hospital.   DISCHARGE CONDITION: Stable.   DISCHARGE DISPOSITION:  Home.  TIME SPENT ON DISCHARGE:  45 minutes.    ____________________________ Gladstone Lighter, MD rk:dmm D: 05/26/2013 15:02:04 ET T: 05/26/2013 22:44:04 ET JOB#: [id number]  cc: Gladstone Lighter, MD, <Dictator> Munsoor Lilian Kapur, MD Deborra Medina, MD Gladstone Lighter MD ELECTRONICALLY SIGNED 05/27/2013 17:52

## 2014-10-23 NOTE — Discharge Summary (Signed)
PATIENT NAME:  David Mueller, David Mueller MR#:  [id number] DATE OF BIRTH:  07-Feb-1946  DATE OF ADMISSION:  06/04/2013 DATE OF DISCHARGE:  06/06/2013  DISCHARGE DIAGNOSES: 1.  Lower gastrointestinal bleed, likely diverticular.  2.  End-stage renal disease on peritoneal dialysis.  3.  Anemia of blood loss or chronic anemia.  4.  Hypertension.  5.  Atrial fibrillation, paroxysmal.   CONDITION ON DISCHARGE: Stable.   CODE STATUS ON DISCHARGE: FULL CODE.   MEDICATIONS ON DISCHARGE:   1.  Renvela 800 mg oral tablet 3 times a day and 2 tablets prior to snacks. 2.  Simvastatin 20 mg oral tablet once a day.  3.  Megace 40 mg/mL oral suspension 20 mL once a day.  4.  Losartan 100 mg once a day.  5.  Metoprolol 50 mg oral tablet, take 1-1/2 tablets 2 times a day.  6.  Amiodarone 200 mg oral tablet once a day.  7.  Multivitamin 1 tablet once a day.  8.  Pantoprazole 40 mg once a day.  9.  Ferrous sulfate 325 mg 3 times a day.   DIET ON DISCHARGE: Low-sodium renal diet. Diet consistency: Regular.   ACTIVITY: As tolerated.   TIMEFRAME TO FOLLOW UP: Within 1 to 2 weeks in GI clinic. Advised to follow in GI clinic in 1 week to schedule colonoscopy for polyp removal and restart aspirin as permitted by him, and routine followups with nephrologist.   HISTORY OF PRESENT ILLNESS:  On the 3rd of December, a 69 year old male was admitted to hospital a week before for complaint of fluid overload. He was status post nephrectomy on peritoneal dialysis and had atrial fibrillation and was started on amiodarone and aspirin, but no anticoagulation due to chronic anemia. Two days ago he noticed in the night, there was some blood mixed with his stool. He called his cardiologist about medication causing the blood in the stool and was told that there is not any medication which can cause that. Also advised to keep watching his stool. The next day, he noticed bleeding again, and so he spoke to the nephrologist and the nephrologist  also agreed that it might be hemorrhoid which is causing him bleeding, but keep watching and if it is severe, then come to the Emergency Room. In the night, he had severe  bleeding and was passing blood clots, felt extremely weak and dizzy and so decided to come to the Emergency Room. His hemoglobin, which was checked the previous day in the nephrologist's office, was 8.2, came to 6.2 on presentation to ER, and was feeling extremely weak.   HOSPITAL COURSE AND STAY:  1.  Acute on chronic anemia due to blood loss. He was transfused 2 units by ER, and the next day on followup, his hemoglobin dropped again, so we gave 2 more units of transfusion. Overall, he  received 5 units of transfusion in the hospital, and then his hemoglobin was stable, so he was discharged home with further followup. GI consult was called in for his lower GI bleed issue, and colonoscopy showed multiple polyps, but no active bleeding, so gastroenterologist suggested him to be discharged and followed up in the clinic within 1 to 2 weeks for removal of the polyps.  2.  End-stage renal disease status post nephrectomy. He was on peritoneal dialysis. Nephrologist was following in the hospital, and the patient continued on peritoneal dialysis in the hospital.  3.  Atrial fibrillation. The patient was in normal sinus rhythm. We continued amiodarone and  metoprolol with no anticoagulation due to anemia and stopped aspirin. Advised to discuss with gastroenterologist about this issue, restarting aspirin later on when permitted.  4. Diastolic congestive heart failure, ejection fraction was 50%. He was not in any decompensated state and no edema, so we continued monitoring.  5.  Hypertension. It was under control with metoprolol. We continued the same, and losartan as his home medication.   CONSULT IN THE HOSPITAL: GI with Dr. Arther Dames, and nephrology with Dr. Murlean Iba.    IMPORTANT LABORATORY RESULTS: Blood culture was negative.  Hemoglobin was 6.1 on presentation, which was 8.2 the previous day in the nephrologist's office. Lipase 153, ethanol level less than 3, troponin less than 0.02. One blood culture was reporting mixture of organisms, Streptococcus viridans and Gemella morbillorum, most likely it was contamination. Hemoglobin dropped to 5.9 again after transfusion, so he received 2 more units of transfusion. The next day morning, his hemoglobin was 7.2, so he received 1 more unit of transfusion, and hemoglobin came to 9 after that.   TOTAL TIME SPENT IN THIS DISCHARGE: 40 minutes.    ____________________________ Ceasar Lund Anselm Jungling, MD vgv:dmm D: 06/10/2013 09:02:00 ET T: 06/10/2013 09:44:25 ET JOB#: [id number]  cc: Ceasar Lund. Anselm Jungling, MD, <Dictator> Murlean Iba, MD Arther Dames, MD Vaughan Basta MD ELECTRONICALLY SIGNED 06/23/2013 14:20

## 2014-10-23 NOTE — H&P (Signed)
PATIENT NAME:  David Mueller, David Mueller MR#:  [id number] DATE OF BIRTH:  1945-09-17  DATE OF ADMISSION:  05/23/2013  REFERRING PHYSICIAN: Dr. Harvest Dark.   PRIMARY CARE PHYSICIAN: Dr. Derrel Nip.   PRIMARY NEPHROLOGIST: Dr. Holley Raring.    CHIEF COMPLAINT: Shortness of breath for 1 day.    HISTORY OF PRESENT ILLNESS: The patient is a pleasant 69 year old male with history of end-stage renal disease on peritoneal dialysis nightly, left nephrectomy for malignancy who comes in for shortness of breath. The patient, of note, has been having increased lower extremity edema for about 10 days or so, about 10-pound weight gain since then, who has been undergoing peritoneal dialysis. Apparently, the peritoneal solution has been getting readjusted as an outpatient, and the patient went to his nephrologist today, who noticed some swelling in the belly, who advised changing the solution again. He started to have shortness of breath and some chest pressure or tightness. Came into the ER where he was tripoding, respiratory rate greater than 30 and elevated blood pressure. He was noted to have acute pulmonary edema, worsening renal failure. He was started on BiPAP, and hospitalist was asked for evaluation and admission. Currently, he states that his chest pressure is better. He feels better on the BiPAP. He has allergy to MORPHINE, and he is oligoric. He has mild hyperkalemia as well. The patient was noted to go in and out of Afib briefly and currently is in sinus.   PAST MEDICAL HISTORY: Hypertension, hyperlipidemia, end-stage renal disease on peritoneal dialysis, history of left renal mass which turned out to be malignant status post nephrectomy in July, history of prostate cancer status post prostatectomy, laser ablation of the lumbosacral disk repair, cystoscopy with kidney stone removal in the past.    ALLERGIES: MORPHINE AND IV DYE.   SOCIAL HISTORY: Ex-smoker. No alcohol or drug use. Married, lives with wife.   FAMILY  HISTORY: Mom in the 12s, no health issues. Father had heart issues.    REVIEW OF SYSTEMS:  CONSTITUTIONAL: Positive for fatigue, weakness, shortness of breath and weight gain of about 10 pounds.   EYES: Denies blurry vision or double vision.  ENT: Denies tinnitus or hearing loss.  RESPIRATORY: Positive for shortness of breath, dyspnea on exertion. A little cough but not out of the ordinary per the patient.   CARDIOVASCULAR: Some chest pressure. Currently, no pain. The pressure is better. No palpitations. Denies CHF history. Has increased lower extremity edema and weight in the last 10 days or so.   GENITOURINARY: Does not pee much.  ENDOCRINE: Denies polyuria or nocturia.   HEMATOLOGIC AND LYMPHATIC: Denies easy bruising. Has chronic anemia.  SKIN: Denies any rashes.  MUSCULOSKELETAL: Denies arthritis or gout. NEUROLOGIC: Denies focal weakness or numbness.    OUTPATIENT MEDICATIONS: Amlodipine 5 mg daily, losartan 50 mg daily, Megace 40 mg/mL 20 mL once a day, metoprolol 25 mg 2 times a day, multivitamin daily, Renvela 800 mg 1 tab 3 times a day and 2 tabs prior to snacks, simvastatin 20 mg daily in the morning and 20 mg at bedtime.   PHYSICAL EXAMINATION:  VITAL SIGNS: Temperature on arrival 97.9, pulse 123, respiratory rate 36, blood pressure initially 168/121, O2 sat 98% on oxygen. Currently, he is breathing at 23 on BiPAP 15/5 and 35% FiO2.   GENERAL: He is a mildly short of breath-appearing male sitting on the bed.  HEENT: Normocephalic, atraumatic. The pupils are equal and reactive. Anicteric sclerae.  Extraocular muscles intact. Moist mucous membranes. The patient has  BiPAP on.  NECK: Supple. Unable to assess for JVD secondary to BiPAP being there. No thyroid tenderness.   CARDIOVASCULAR: S1, S2, tachycardic. No significant murmurs appreciated.  LUNGS: Decreased breath sounds. Mild crackles at the bases. Otherwise, good air entry at mid and upper lobes.   ABDOMEN: Soft, nontender,  nondistended. Positive bowel sounds in all quadrants. The patient has a peritoneal dialysis catheter without any surrounding cellulitis.  EXTREMITIES: The patient has 2+ pitting edema to slightly above the knees.  NEUROLOGIC: Cranial nerves II through XII grossly intact. Strength is 5 out of 5 in all extremities. Sensation is intact to light touch.  PSYCHIATRIC: Awake, alert, oriented x 3.    LABORATORIES AND IMAGING: Glucose 116, BUN 64. Of note, it was 14 in June of this year. Creatinine is 13.8. It was 5.3 in June of this year. Sodium 138, potassium 5.4. LFTs show albumin of 3, otherwise within normal limits. Troponin negative. CK-MB 1.2. White count of 13.7, platelets of 347, hemoglobin 8.2. EKG: Sinus tach. Nonspecific ST changes. No acute ST elevations or depressions. Chest x-ray, 1 view, showing hyperinflated lungs. Chronic interstitial coarsening and apical emphysema. Diffuse fine interstitial opacities with decreased bronchial cuffing, showing pulmonary edema likely. No effusion. No pneumothorax.   ASSESSMENT AND PLAN: We have a 69 year old male with peritoneal dialysis who comes for increased lower extremity edema, shortness of breath and some chest pressure.   The patient does have acute respiratory failure on admission requiring BiPAP currently. He came in tripoding, using accessory muscles with respiratory rate in the 30s. Currently better on the BiPAP, and the pressures are better as well. Would keep him on the BiPAP. Admit him to the hospital. Unfortunately, he is pretty much oliguric and will not likely respond to Lasix. Also, he has allergy to MORPHINE and cannot give him that. Will keep him on the BiPAP for now. Control his blood pressure and obtain a nephrology consult. The patient's acute respiratory failure likely is secondary to acute flash pulmonary edema. Suspect this is secondary to the renal issues, but I cannot determine if the patient has also underlying congestive heart failure.  He might have underlying diastolic congestive heart failure. He has had a recent echocardiogram at [facility] which I will go ahead and get the records of. In the meantime, I will go ahead and give him some hydralazine to bring down his pressure. It is possible that he is failing outpatient peritoneal dialysis suggested by his elevation of BUN and creatinine, hyperkalemia and increased weight gain. We will see what his nephrologist would recommend. He usually is followed by Dr. Holley Raring. Would admit him to telemetry. The patient's chest pressure likely is secondary to his respiratory failure and pulmonary edema. He has no acute ST changes on the EKG. Negative troponin x 1. Would cycle the troponins, and if we cannot get the [place] echocardiogram results, would go ahead and do one here. In regards his hyperkalemia, this is likely secondary to his renal issues. Will give him some Kayexalate, and he should get better with dialysis. I suspect he will need some hemodialysis. In regards to his systemic inflammatory response syndrome criteria which he appears to have, this could be reactive. He has no fever. He has mild leukocytosis and tachycardia. He denies having any significant change in his cough. He has no significant rashes. No recent gastrointestinal issues as well. Would monitor this. The patient does have worsening anemia. This is likely chronic. There is no  recent gastrointestinal bleeding reported per the patient. The patient was noted to be going in and out of atrial fibrillation per Emergency Room physician. Currently, he is in sinus tachycardia. Would obtain a cardiology consult. Continue his statin, aspirin and beta blocker.   TOTAL CRITICAL CARE TIME SPENT: 55 minutes.   CODE STATUS: The patient is FULL CODE.    ____________________________ Vivien Presto, MD sa:gb D: 05/23/2013 01:24:06 ET T: 05/23/2013 02:10:12 ET JOB#: [id number]  cc: Vivien Presto, MD,  <Dictator> Vivien Presto MD ELECTRONICALLY SIGNED 06/09/2013 20:01

## 2014-10-23 NOTE — Consult Note (Signed)
PATIENT NAME:  David Mueller, David Mueller MR#:  [id number] DATE OF BIRTH:  04-Mar-1946  DATE OF CONSULTATION:  06/10/2013  REFERRING PHYSICIAN:  Dr. Lavetta Nielsen CONSULTING PHYSICIAN:  Keith Rake, MD/ Janalyn Harder. Jerelene Redden, ANP (Adult Nurse Practitioner)  REASON FOR CONSULTATION: GI bleed.   HISTORY OF PRESENT ILLNESS: This 69 year old African American patient with history of end-stage renal disease on peritoneal dialysis, has a history of lower gastrointestinal bleed with recent discharge 06/06/2013, with etiology thought to be diverticular in nature. No bleeding source was found on the colonoscopy performed last week with the exception of old blood in the left colon and the right side looked very clear. Etiology  was thought to be due to diverticulosis on the left side unless the colon prep had washed away all the blood on the right side, which is unusual. The patient passed a scant amount of blood on discharge 06/06/2013, which was thought to be expected with the post GI bleed timeframe.   The patient went home and had no problems with abdominal pain, diarrhea or constipation. He saw a scant amount of blood over the weekend, but yesterday he had a normal brown formed stools x 3 which had bright red blood streaks on the outside of the stool. He had a fourth movement that was nothing but mushy, bloody stool. The patient says it was a medium red but his wife reports it was bright red. The patient was concerned that this was the beginning of gastrointestinal bleed again and presented to the Emergency Room. His admitting hemoglobin was 8.0 and this is down from discharge hemoglobin 9.0. Overnight, hemoglobin was in the 7.2 range. The patient has had no further passage of stool or blood since he was brought to the Emergency Room last evening.   He reports that he is very hungry and is n.p.o. right now. The patient is requesting clear liquids. He denies any abdominal pain whatsoever. He does have recent nephrectomy incision and  that is mildly tender to palpation. The patient denies heartburn or reflux, nausea, vomiting, or upper abdominal pain. He reports normal appetite, diet and weight.   PAST MEDICAL HISTORY: 1.  End-stage renal disease on peritoneal dialysis secondary post nephrectomy for left renal cell carcinoma.  2.  Hypertension.  3.  Diastolic congestive heart failure with recent hospitalization November 21 through November 24 for acute respiratory failure secondary to acute pulmonary edema from diastolic dysfunction.  4.  Atrial fibrillation with rapid ventricular rate.  5.  History of paroxysmal atrial fibrillation in the past treated with amiodarone and metoprolol.  6.  Hypertension.  7.  Acute on chronic anemia. Previous blood transfusion.  8.  Prostate cancer status post prostatectomy.  9.  Nephrolithiasis.  10.  Diverticulosis per colonoscopy 06/05/2013 with multiple small polyps, not resected.  11.  Degenerative joint disease/degenerative disc disease. 12.  Hypercholesterolemia.    PAST SURGICAL HISTORY: 1.  Nephrectomy July 2014.  2.  Prostatectomy.  3.  Laser ablation of lumbosacral repair.  4.  Cystoscopy with the kidney stone removal.   MEDICATIONS ON ADMISSION: 1.  Amiodarone 200 mg p.o. daily.  2.  Ferrous sulfate 325 mg p.o. 3 times daily.  3.  Losartan 100 mg p.o. daily.  4.  Megace 20 mL p.o. daily.  5.  Metoprolol 75 mg p.o. b.i.d.  6.  Multivitamin 1 daily.  7.  Pantoprazole 40 mg p.o. daily.  8.  Renvela 800 mg times daily and 2 tablets before snacks.  9.  Simvastatin 20 mg  at bedtime.   ALLERGIES: IV DYE AND MORPHINE.   SOCIAL HISTORY: The previous tobacco, quit 45 years ago. Negative alcohol or illicit drug use.   FAMILY HISTORY: Positive for coronary artery disease. Negative for colorectal polyp, neoplasm, or GI malignancy. Negative for inflammatory bowel disease.    REVIEW OF SYSTEMS: Ten systems reviewed, and pertinent positives are noted in  history of present  illness; otherwise, negative. The patient only complaint is that he is hungry. He has had no GI complaints otherwise. Says he feels somewhat weak and tired.   PHYSICAL EXAMINATION: VITAL SIGNS: Temperature 98.2, heart rate 75, respirations 20, blood pressure 167/89. Pulse oximetry on room air is 97%.  GENERAL: Elderly African American male, looks pale in no acute distress.  HEENT: Default negative. Conjunctivae pale pink. Sclerae anicteric. There is an ashen color to his complexion. Oral mucosa is moist and intact.  NECK: Supple. Trachea midline.  CARDIAC: S1, S2 without murmur, rub, or gallop.  LUNGS: Clear to auscultation. Respirations are eupneic.  ABDOMEN: Soft, his peritoneal catheter in place, vertical incision in the abdomen has a little scar tissue and is mildly tender to palpation. The remaining abdomen is nontender.  RECTAL: Deferred.  SKIN: Warm and dry. Color pale.  EXTREMITIES: Lower extremities without edema.  PSYCHIATRIC: Affect and mood within normal. The patient is pleasant.  NEUROLOGIC: Cranial nerves II through XII grossly intact. The patient is sitting up in the chair. Gait not evaluated.   LABORATORY, DIAGNOSTIC, AND RADIOLOGICAL DATA: BUN 51, creatinine 15.40, sodium 140, K 4.5, albumin 2.3, hemoglobin 8 and that is down from discharge last week of 9 and now at 7.2. Platelet count 250 to 196. Pro time 13.2, INR 1.0, PTT 28.   No radiology reports to comment.   IMPRESSION: The patient presents with repeat admission for bright red blood per rectum. He had a  colonoscopy that was evaluated well and showed no blood at all in the right colon. There was old blood in the left colon that had clearly stopped bleeding at the completion of the colonoscopy. It is unlikely that he has  bleeding from the right colon as it would have washed clean with the bowel prep. It is most likely that he has had diverticular bleed. Even one small tick can cause of significant bleeding. Dr. Vira Agar  would like to rule out Meckel's diverticulum with Meckel scan tomorrow morning. If that is negative, we have considered  capsule endoscopy but will not be able to do that because the patient presents on oral iron therapy and that will likely discolor the small bowel to the point that we could miss lesions. It is possible that he could have had arteriovenous malformations in the colon that had blanched and were not seen during the colonoscopy. It is unlikely that this is an ischemic colon problem given the absence of abdominal pain or tenderness. We would expect an upper gastrointestinal bleed to have passage of darker blood and the patient's wife confirms that she looked at his bowel movement and it was clearly bright red in color.   PLAN:  1.  Proceed with Meckel scan in the morning. Dr. Vira Agar wants to keep him npo for now.  Obtain stat hemoglobin for consideration of blood transfusion in preparation for the esophagogastroduodenoscopy tomorrow afternoon.  2.  If a capsule endoscopy is required, the patient will need to be off of his iron tablets for one week.  3. Further gastrointestinal recommendations pending results. This case was discussed with  Dr. Vira Agar in collaboration of care.   Thank you for the consultation.   These services provided Denice Paradise, ANP under collaborative agreement with Adria Dill, MD  ____________________________ Janalyn Harder. Jerelene Redden, ANP (Adult Nurse Practitioner) kam:cc D: 06/10/2013 16:25:35 ET T: 06/10/2013 18:12:05 ET JOB#: [id number]  cc: Joelene Millin A. Jerelene Redden, ANP (Adult Nurse Practitioner), <Dictator> Janalyn Harder Sherlyn Hay, MSN, ANP-BC Adult Nurse Practitioner ELECTRONICALLY SIGNED 06/11/2013 8:07

## 2014-10-23 NOTE — Consult Note (Signed)
Details:   - Colonoscopy done today.   Findings: old blood L colon, diverticulosis.  No blood in R colon.  Many polyps that were not resected.   Bleeding had already stopped.   Likely source of bleeding : diverticular  Plan:   - Check hgb in am. - If stable, likely d/c tomorrow. - will need repeat colonoscopy as outpatient for polyp removal.   Electronic Signatures: Arther Dames (MD)  (Signed 04-Dec-14 17:37)  Authored: Details   Last Updated: 04-Dec-14 17:37 by Arther Dames (MD)

## 2014-10-23 NOTE — Discharge Summary (Signed)
PATIENT NAME:  David Mueller, David Mueller MR#:  [id number] DATE OF BIRTH:  05/01/1946  DATE OF ADMISSION:  06/09/2013 DATE OF DISCHARGE:  06/13/2013  DISCHARGE DIAGNOSES: 1.  Lower gastrointestinal bleed secondary to diverticulosis/colon polyps.  2.  Anemia of chronic disease and acute blood loss anemia.  3.  End-stage renal disease on peritoneal dialysis.  4.  Hypertension.   CONSULTANTS: 1.  Dr. Holley Raring of nephrology.  2.  Dr. Vira Agar with GI.   ADMITTING HISTORY AND PHYSICAL AND HOSPITAL COURSE:  Please see detailed H and P dictated by Dr. Nadara Mustard.  In brief, a 69 year old African American male patient with recent history of GI bleed secondary to diverticulosis, colonoscopy showing polyps and diverticulosis presented to the hospital with recurrent GI bleed. The patient had noticed 4 episodes of bright red blood per rectum with stool. The patient was admitted for further work up and treatment. The patient's hemoglobin has been stable in the hospital. No further bleeding. All anticoagulants were stopped. No heparin, Lovenox given. The patient was seen by Dr. Vira Agar of GI. Initially, partial colectomy was considered if he bleeds again but this was not thought to be possible as he is on peritoneal dialysis. Presently, plan is for patient to be discharged home as he has not had any bleeding in the hospital over the last 3 days, no drop in hemoglobin and patient will get mesentery angiogram with vascular surgery if he has any further bleeding.   Today on examination, the patient does not have any abdominal tenderness. Bowel sounds are normal, has tolerated his diet and is up and ambulating well.   DISCHARGE MEDICATIONS: Include:  1.  Renvela 3 tablets oral 3 times a day.  2.  Simvastatin 20 mg oral once a day.  3.  Megace 40 mg/mL 20 mg oral once a day as needed.  4.  Losartan 100 mg oral once a day.  5.  Metoprolol tartrate 50 mg 1.5 tablets oral 2 times a day.  6.  Amiodarone 200 mg oral once a day.  7.   Multivitamin 1 tablet oral once a day.  8.  Protonix 40 mg oral once a day.  9.  Ferrous sulfate 325 mg oral 3 times a day.   DISCHARGE INSTRUCTIONS: Renal diet. Activity as tolerated. Follow up with Dr. Vira Agar for colonoscopy in 1 or 2 weeks for polypectomy.   TIME SPENT ON DAY OF DISCHARGE IN DISCHARGE ACTIVITY:  40 minutes   ____________________________ Leia Alf. Karmela Bram, MD srs:ce D: 06/13/2013 13:57:21 ET T: 06/13/2013 14:19:22 ET JOB#: [id number]  cc: Alveta Heimlich R. Darvin Neighbours, MD, <Dictator> Munsoor Lilian Kapur, MD Manya Silvas, MD Arther Dames, MD Neita Carp MD ELECTRONICALLY SIGNED 06/22/2013 10:37

## 2014-10-23 NOTE — Consult Note (Signed)
Pt with CC lower GI bleeding likely colonic in origin with diverticulosis on colonoscopy.  Will check a Meckel scan tomorrow.  If rebleeds consider partial colectomy if this scan and a possible small bowel capsule is neg.  I asked patient and wife of any choice for surgeon.  Will continue discussion tomorrow.  Please keep on clear liquids for the next 2 days to lessen the chance of disruption of the clot that has stopped the bleeding.  See NP Dawson Bills note for complete consult.  Electronic Signatures: Manya Silvas (MD)  (Signed on 09-Dec-14 18:08)  Authored  Last Updated: 09-Dec-14 18:08 by Manya Silvas (MD)

## 2014-10-23 NOTE — H&P (Signed)
PATIENT NAME:  David Mueller, David Mueller MR#:  [id number] DATE OF BIRTH:  19-Oct-1945  DATE OF ADMISSION:  06/04/2013  PRIMARY CARE PHYSICIAN: Dr. Deborra Medina   PRIMARY NEPHROLOGIST:  Dr. Candiss Norse.   PRIMARY CARDIOLOGIST: Dr. Fletcher Anon.   REFERRING EMERGENCY ROOM PHYSICIAN: Dr. Hinda Kehr   CHIEF COMPLAINT: Bleeding per rectum.   HISTORY OF PRESENT ILLNESS: The patient is a 69 year old male who was admitted to the hospital last week because of complaint of fluid overload. He is status post nephrectomy and on peritoneal dialysis. He had atrial fibrillation and started on amiodarone, but no anticoagulation due to chronic anemia last week and was sent home Two days ago, he noticed in the night, he had some blood mixed with his stool and he got alarmed and called his cardiologist about any medication causing the blood in his stool, but he did not have any medications like that as per review by the cardiologist on the phone and advised him to keep watching his stool. The next day he noticed the bleeding continued mixed with his stool, and he went to nephrology hemodialysis center to see Dr. Candiss Norse and that was yesterday. He checked his hemoglobin and that was stable. Dr Candiss Norse reviewed his medication again and advised him to keep watching his stool as likely it might be due to his constipation and hemorrhoids as he was on iron therapy. So he was reassured and sent home. In the evening, he started his peritoneal dialysis. He got up to go to the bathroom in between. He also started having now, loose stool, diarrhea-like mixed with blood. So he was taking Imodium to get relief from that, but was not getting much help. Later on in the morning, when he got up, he was feeling very weak and he called his wife to support him to go to the bathroom. While he stood up  and reached for the bathroom, he had  blood clots came off his rectum and dropped on the floor and he felt excessively weak so he sat on the commode and wife called EMS.  They found, blood and clots all over the floor and brought him to the Emergency Room.  On hemoglobin check-up in the ER, his hemoglobin which was 8.2 yesterday was found to be 6 today. The patient was hemodynamically stable but feeling extremely weak so being admitted for lower GI bleed. On further questioning, he denies any fever, nausea, vomiting, or any similar episode in the past.   REVIEW OF SYSTEMS:  CONSTITUTIONAL: Negative for fever, but positive for fatigue and generalized weakness. No pain or weight loss.  EYES: No blurring, double vision or discharge.  EARS, NOSE, THROAT: No tinnitus, ear pain or hearing loss.  RESPIRATORY: No cough, wheezing, hemoptysis or shortness of breath.  CARDIOVASCULAR: No chest pain, orthopnea, edema, or arrhythmia.  GASTROINTESTINAL: No nausea or vomiting but has had diarrhea since yesterday, which is mixed with blood. No abdominal pain, but overall mild tenderness.  GENITOURINARY: No dysuria, hematuria or increased frequency.  ENDOCRINE: No  nocturia, heat or cold intolerance.  SKIN: No acne, rashes or lesions.  MUSCULOSKELETAL: No pain or swelling in the joints.  NEUROLOGICAL: No numbness, weakness, tremors or vertigo.  PSYCHIATRIC: No anxiety, insomnia or bipolar disorder.   PAST MEDICAL HISTORY:  1.  Hypertension.  2.  Hyperlipidemia.  3.  End-stage renal disease on peritoneal dialysis. 4.  History of left renal mass which was malignant, status post nephrectomy in July 2014 at [facility].  5.  History  of prostate cancer status post prostatectomy and laser ablation of lumbosacral disk repair.  6.  Hysteroscopy with kidney stone removal. 7.  Episode of atrial fibrillation in recent admission. Not on any anticoagulation due to chronic anemia.   ALLERGIES: MORPHINE AND IV DYE.   SOCIAL HISTORY: Ex-smoker. No alcohol or drug use. Married lives with wife.   FAMILY HISTORY: Mother in 59s. No health issue.  Father had some bone cancer and heart issues. He died.    HOME MEDICATIONS: 1.  Simvastatin 20 mg once a day.  2.  Renvela 800 mg oral tablet 3 times a day prior to meals.  3.  Multivitamin 1 tablet once a day.  4.  Metoprolol 50 mg take 1.5 tablets 2 times a day.  5.  Megace 40 mg per mL oral solution 20 mL once a day.  6.  Losartan 100 mg once a day.  7.  Aspirin 81 mg once a day.  8.  Amiodarone 200 mg once a day.   PHYSICAL EXAMINATION: VITAL SIGNS: In ER, temperature 97.8, pulse 76, respirations 18, blood pressure 122/62 pulse oximetry 89 on room air.  GENERAL: The patient is fully alert and oriented slightly distressed due to weakness, but cooperative with history taking and physical examination.  HEAD AND NECK: Atraumatic. Conjunctivae pale. Oral mucosa moist. Neck: Supple. No JVD.  RESPIRATORY: Bilateral clear and equal air entry.  CARDIOVASCULAR: S1, S2 present, regular. No murmur.  ABDOMEN: Soft. Mild tenderness all over.  No mass felt.  Bowel sounds normal.  SKIN: No rashes.  LEGS: No edema.  JOINTS: No swelling or tenderness.  NEUROLOGICAL: Power 5/5. Follows commands. Moves all 4 limbs. No tremors.  PSYCHIATRIC: Does not appear in any acute psychiatric illness at this time.  IMPORTANT LABORATORY AND IMAGING RESULTS: Glucose 116, BUN 50, creatinine 14.33, sodium 140, potassium 4.8, chloride 100, CO2 of 30, calcium 8.3, magnesium 2.9, atenolol level less than 3. Total protein 5.3, albumin 2.5, bilirubin 0.3, alkaline phosphate 40, SGOT 9, SGPT 18, troponin less than 0.02. WBC 5.1, hemoglobin was 8.2 yesterday and 6.1 today, platelet count 252, MCV 94. INR is 1. Chest x-ray, portable, shows lung appears clear. Cardiac and mediastinal contours are normal.  No pleural effusion. No active disease.   ASSESSMENT AND PLAN: A 69 year old male with a past medical history of end-stage renal disease and nephrectomy due to renal cancer and on peritoneal dialysis was taking aspirin for his atrial fibrillation and came with blood per rectum and  feeling excessively weak. Hemoglobin dropped from 8.2 to 6.1 in 1 day. 1.  Lower GI bleed with symptomatic acute on chronic anemia due to blood loss. Emergency room physician ordered 2 units of blood transfusion. I agree with that. We will keep him n.p.o. except medications and we will stop aspirin and we will not give any anticoagulation.  We will call for GI consult for further management. As per patient, he had a colonoscopy done a few years ago, which was normal.  2.  End-stage renal disease, status post nephrectomy on peritoneal dialysis. We will call Nephrology consult to manage this issue in the hospital.  3.  Atrial fibrillation, currently in normal sinus rhythm. We will continue amiodarone and metoprolol. No anticoagulation due to anemia.  4.  High blood pressure is stable under control with metoprolol. We will continue the same and losartan.  5.  Diastolic congestive heart failure, ejection fraction 50% to 55%. This is a chronic issue, currently not in decompensated state.  No  edema. Continue monitoring. Condition is critical due to acute drop in hemoglobin and having symptomatic anemia. We will transfuse 2 units.  I explained to the patient about the benefits and risks of blood transfusion including reactions.  He agreed to have the blood transfusion and we will proceed with it.    TOTAL CRITICAL CARE TIME SPENT IN THIS ADMISSION:  50 minutes.   ____________________________ Ceasar Lund Anselm Jungling, MD vgv:dp D: 06/04/2013 09:19:23 ET T: 06/04/2013 09:58:06 ET JOB#: [id number]  cc: Ceasar Lund. Anselm Jungling, MD, <Dictator> Muhammad A. Fletcher Anon, MD Deborra Medina, MD Vaughan Basta MD ELECTRONICALLY SIGNED 06/10/2013 9:21

## 2014-10-23 NOTE — Op Note (Signed)
PATIENT NAME:  David Mueller, David Mueller MR#:  [id number] DATE OF BIRTH:  Apr 19, 1946  DATE OF PROCEDURE:  10/08/2012  PREOPERATIVE DIAGNOSIS: Bladder tumor.   POSTOPERATIVE DIAGNOSIS:  Bladder tumor.  PROCEDURE: Cystoscopy, bladder biopsy.   SURGEON: Edrick Oh, M.D.   ANESTHESIA: Laryngeal mask airway anesthesia.   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 Jacqlyn Larsen, MD bsc:ct D: 10/08/2012 08:04:28 ET T: 10/08/2012 08:27:51 ET JOB#: [id number]  cc: Denice Bors. Jacqlyn Larsen, MD, <Dictator> Denice Bors Aris Even MD ELECTRONICALLY SIGNED 10/08/2012 9:20

## 2014-10-23 NOTE — Consult Note (Signed)
General Aspect David Mueller is a 69yo male w/ PMHx s/f PAF, ESRD (on nightly PD), recurrent nephrolithiasis, RCC s/p L nephrectomy, prostate CA s/p prostatectomy, anemia of chronic disease, HTN, HLD and h/o tobacco abuse who was admitted to [facility] this AM with acute pulmonary edema and transient a-fib with RVR.   He had been experiencing hypotension with HD last year. He now undergoes nightly PD.He has a history of recurrent nephrolithiasis and resultant atrophic R kidney. October 2013, he underwent ureteral stenting last year complicated by peri-nephric hemorrhage and acute renal failure. He required management in the ICU, transfusion, renal cauterization and dialysis. He and his wife report that he has never been formally diagnosed with atrial fibrillation. He was placed on amiodarone transiently during his ICU stay last year for "fast heart rate."  He underwent total L nephrectomy [facility] in 12/2012 for recurrent RCC. There was no metastasis. MRI suggested possible thrombus, however follow-up echo in 04/2013 did not reveal this. They describe the finding as a "shadow."   He had been experiencing hypotension with HD last year. He now undergoes nightly PD. He reported a 10 lbs weight increase, shortness of breath, LE edema, PND and orthopnea over the past week. He follows up with Dr. Holley Raring. Peritoneal solutions have been adjusted recently as an outpatient to pull more fluid off. His breathing worsened significantly last night. He noticed associated chest pressure and tachy-palpitations last night. At baseline, he denies exertional chest pain, dyspnea or palpitations.   Present Illness In the ED, EKG revealing sinus tachycardia, no ST/T changes. There was mention of intermittent atrial fribillation in the ED. There are no strips of this in the chart or documented on telemetry since his arrival to the floor. Initial TnI returned at 0.04. BNP markedly elevated at [address]. BUN 64/Cr 13.89. K 5.4. Albumin 3.0.  CBC- WBC  13.7K, Hgb 8.2/Hct 24.9. CXR showed pulmonary edema on a background of COPD. A second troponin returned mildly elevated at 0.06. He was tripoding, RR 30s, hypoxic. Placed on BiPAP with improvement. Hypertensive on arrival, imrpoved on hydralazine. He was admitted by the medicine service. Kayexelate was gievn. Renal was consulted for urgent PD which was performed this AM. He was noted to be 4kg above end dialysis weight at 182.8 lbs/82.9 kg. Echo had apparently been performed at [facility] recently, and the primary team was in the process of obtaining this.  He remains in NSR. No evidence of atrial fibrillation on telemetry.   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    ALLERGIES: MORPHINE AND IV DYE.   SOCIAL HISTORY: Ex-smoker. No alcohol or drug use. Married, lives with wife.   FAMILY HISTORY: Mom in the 35s, no health issues. Father had heart issues.   Physical Exam:  GEN no acute distress, on BiPAP undergoing PD   HEENT pale conjunctivae, PERRL, hearing intact to voice   NECK supple  No masses  trachea midline  JVP 6-7 cm   RESP respirations supported by BiPAP, scattered wheezing, bibasilar rales, no appreciable rhonchi   CARD Regular rate and rhythm  Normal, S1, S2  No murmur   ABD denies tenderness  distended  hypoactive BS   EXTR negative cyanosis/clubbing, 1+ pitting bilateral pretibial edema   SKIN normal  to palpation, tight to palpation   NEURO follows commands, motor/sensory function intact   PSYCH alert, A+O to time, place, person   Review of Systems:  Subjective/Chief Complaint shortness of breath, palpitations, chest tightness, bipap in place   General: Fatigue   Weakness   Skin: No Complaints  Color changes  Hair and nail pages   ENT: No Complaints    Eyes: No Complaints    Neck: No Complaints    Respiratory: No Complaints  Short of breath   Cardiovascular: Tightness  Palpitations  Dyspnea   Gastrointestinal: abdominal distention   Genitourinary: No Complaints    Vascular: No Complaints    Musculoskeletal: No Complaints    Neurologic: No Complaints    Hematologic: No Complaints    Endocrine: No Complaints    Psychiatric: No Complaints    Review of Systems: All other systems were reviewed and found to be negative   Medications/Allergies Reviewed Medications/Allergies reviewed    Home Medications: Medication Instructions Status  losartan 50 mg oral tablet 1 tab(s) orally once a day Active  amLODIPine 5 mg oral tablet 1 tab(s) orally once a day Active  simvastatin 20 mg oral tablet 1 tab(s) orally once a day (at bedtime) Active  metoprolol 25 mg oral tablet 1 tab(s) orally 2 times a day Active  multivitamin   once a day Active  Megace 40 mg/mL oral suspension 20 milliliter(s) orally once a day Active  simvastatin 20 mg oral tablet 1 tab(s) orally once a day (in the morning) Active  Renvela carbonate 800 mg oral tablet 1 tab(s) orally 3 times a day and 2 tabs prior to snacks Active   Lab Results:  Hepatic:  20-Nov-14 23:35   Albumin, Serum  3.0  Routine Chem:  20-Nov-14 23:35   BUN  64  Creatinine (comp)  13.89  Potassium, Serum  5.4  B-Type Natriuretic Peptide [facility])  [id number]  Cardiac:  20-Nov-14 23:35   Troponin I 0.04 (0.00-0.05 0.05 ng/mL or less: NEGATIVE  Repeat testing in 3-6 hrs  if clinically indicated. >0.05 ng/mL: POTENTIAL  MYOCARDIAL INJURY. Repeat  testing in 3-6 hrs if  clinically indicated. NOTE: An increase or decrease  of 30% or more on serial  testing suggests a  clinically important change)  CK, Total 189  CPK-MB, Serum 1.2 (Result(s) reported on 23 May 2013 at 12:04AM.)  21-Nov-14 07:04    Troponin I  0.06 (0.00-0.05 0.05 ng/mL or less: NEGATIVE  Repeat testing in 3-6 hrs  if clinically indicated. >0.05 ng/mL: POTENTIAL  MYOCARDIAL INJURY. Repeat  testing in 3-6 hrs if  clinically indicated. NOTE: An increase or decrease  of 30% or more on serial  testing suggests a  clinically important change)  CK, Total 129  CPK-MB, Serum 0.9 (Result(s) reported on 23 May 2013 at [facility].)  Routine Hem:  20-Nov-14 23:35   WBC (CBC)  13.7  RBC (CBC)  2.63  Hemoglobin (CBC)  8.2  Hematocrit (CBC)  24.9  Platelet Count (CBC) 347 (Result(s) reported on 22 May 2013 at 11:55PM.)  MCV 95  MCH 31.2  MCHC 33.0  RDW  16.0   EKG:  Interpretation EKG shows sinus tachycardia, borderline LVH, no ST/T changes at noon, tele showed atrial fibrillation with rates >170 bpm   Rate 125   Radiology Results: XRay:    20-Nov-14 23:39, Chest Portable Single View  Chest Portable Single View   REASON FOR EXAM:    Chest pain  COMMENTS:  PROCEDURE: DXR - DXR PORTABLE CHEST SINGLE VIEW  - May 22 2013 11:39PM     CLINICAL DATA:  Chest pain    EXAM:  PORTABLE CHEST - 1 VIEW    COMPARISON:  12/11/2012    FINDINGS:  Hyperinflated lungs with chronic interstitial coarsening and apical  emphysematous changes. There is also superimposed diffuse fine  interstitial opacities and increased bronchial cuffing. Normal heart  size. No effusion or pneumothorax.     IMPRESSION:  1. Pulmonary edema.  2. Background COPD.      Electronically Signed    By: Jorje Guild M.D.    On: 05/23/2013 00:08         Verified By: Gilford Silvius, M.D.,    Morphine Sulfate: Resp Arrest  IVP Dye: Rash  Vital Signs/Nurse's Notes: **Vital Signs.:   [id number] 11:57  Systolic BP Systolic BP 262  Diastolic BP (mmHg) Diastolic BP (mmHg) 035    59:74  Systolic BP Systolic BP 163  Diastolic BP (mmHg) Diastolic BP (mmHg) 90    84:53  Systolic BP Systolic BP 646  Diastolic BP (mmHg) Diastolic BP  (mmHg) 90    80:32  Systolic BP Systolic BP 122  Diastolic BP (mmHg) Diastolic BP (mmHg) 482    05:31  Vital Signs Type Routine  Temperature Temperature (F) 98.3  Celsius 36.8  Pulse Pulse 95  Respirations Respirations 18  Systolic BP Systolic BP 500  Diastolic BP (mmHg) Diastolic BP (mmHg) 91  Mean BP 115  Pulse Ox % Pulse Ox % 98  Oxygen Delivery Non-invasive ventilation (CPAP/BIPAP)    Impression Above note co-written with Valeria Batman and Esmond Plants  [phone] male w/ PMHx s/f PAF, ESRD (on nightly PD), recurrent nephrolithiasis, RCC s/p L partial nephrectomy, prostate CA s/p prostatectomy, anemia of chronic disease, HTN, HLD and h/o tobacco abuse who was admitted to [facility] this AM with acute pulmonary edema and transient a-fib with RVR.   1. Acute respiratory failure/pulmonary edema Secondary to volume overload from ESRD, exacerbated by atrial fib (paroxysmal).  Patient was ~ 4 kg above dry weight. Respirations and hypoxia improved on PD.  -- Review 2D echo -- Limit IVFs, salt/fluid intake -- Nephrology to manage volume status --Will work on rate control (currently in atrial fibrillation)  2. ESRD on PD qHS, h/o RCC s/p L nephrectomy -- Nephrology following  3. PAF w/ RVR Atrial fibrillation just now identified on telemetry. HR 160-180s. Lopressor IV administered, multiple doses and po given.  He had been on amiodarone transiently in the ICU at [facility] last year. HR has been well-controlled on Lopressor. He is not on anticoagulation.this was discussed with the patientys wife. She does not want anticoagulation. She is aware of risk of CVA and benefit.  There is a note from his PCP from 04/2012 with a diagnosis of presumed a-fib d/t prior amiodarone treatment. Anticoagulation was deferred at that time due to recent peri-nephric bleeding. This was in the L kidney which is now removed. Atrial fibrillation likely secondary to respiratory failure, hypoxia, hyperkalemia, underlying anemia  and volume overload. BNP over-estimated d/t ESRD. CXR with pulmonary edema, COPD. CHADSVASc = 3 (CHF, HTN, age > 47).  -- Anticoagulation options for HD include Coumadin and Eliquis.  -- Continue metoprolol IV and PO, will add diltiazem 30 q6, will start amiodarone infusion.   Plan 4. Hyperkalemia Given Kayexelate yesterday. -- Review K today, avoid metabolic derangements  5. HTN SBP 160s today. Goal < 140/90. Currently being diuresed. If remains elevated  at dry weight,  -- increase Norvasc to 10mg   -- could switch BB to carvedilol with alpha activity  6. HLD Check lipid panel.  -- Continue simvastatn   Electronic Signatures: Meriel Pica (PA-C)  (Signed 21-Nov-14 12:04)  Authored: General Aspect/Present Illness, History and Physical Exam, Review of System, Home Medications, Labs, EKG , Radiology, Allergies, Vital Signs/Nurse's Notes, Impression/Plan Ida Rogue (MD)  (Signed 21-Nov-14 12:34)  Authored: Review of System, EKG , Impression/Plan  Co-Signer: General Aspect/Present Illness, History and Physical Exam, Review of System, Home Medications, Labs, EKG , Radiology, Allergies, Vital Signs/Nurse's Notes, Impression/Plan   Last Updated: 21-Nov-14 12:34 by Ida Rogue (MD)

## 2014-10-23 NOTE — Discharge Summary (Signed)
PATIENT NAME:  David Mueller, David Mueller MR#:  [id number] DATE OF BIRTH:  Jan 28, 1946  DATE OF ADMISSION:  12/12/2012 DATE OF DISCHARGE:    PRIMARY CARE PHYSICIAN:  Dr. Derrel Nip.  DISCHARGE DIAGNOSES: 1.  Acute bronchitis.  2.  Acute respiratory failure.  3.  End-stage renal disease.  4.  Hypertension.   IMAGING STUDIES DONE:  Include:  1.  A chest x-ray which showed no acute abnormalities.  2.  CT of the chest showed no PE.  Did show the large renal hematoma, which is resolving.  This was known previously.   ADMITTING HISTORY AND PHYSICAL:  Please see detailed H and P dictated previously.  In brief, a 69 year old male patient with a history of end-stage renal disease, hypertension, admitted to the hospital complaining of shortness of breath and cough.  The patient did need oxygen with acute respiratory failure, was admitted to the hospitalist service.  Started on broad-spectrum ceftriaxone and azithromycin, had a round of dialysis after which he improved well.  The patient seemed to have mild fluid overload along with acute bronchitis, wheezing initially, but on the day of discharge on exam, the patient does not have any further wheezing.  He is being switched to by mouth Levaquin 250 mg every other day along with prednisone taper over six days.  He has been given a rescue inhaler with albuterol in case he needs it and is being discharged home in a fair condition.  The patient was ambulatory around the hallway and had saturations of 97% on ambulation without any shortness of breath on exertion.   DISCHARGE MEDICATIONS: 1.  Levaquin 250 mg oral every other day for seven days.  2.  Simvastatin 20 mg oral once a day.  3.  Centrum Silver 1 tablet oral once a day.  4.  Renvela 800 mg 1 tablet oral 3 times a day.  5.  Amlodipine 10 mg oral once a day.  6.  Losartan 100 mg oral once a day.  7.  Prednisone 60 mg tapered over six days by 10 mg a day.  8.  Albuterol ProAir HFA inhaler 2 puffs inhaled every 4 hours  as needed for shortness of breath.   DISCHARGE INSTRUCTIONS:  Renal diet of regular consistency.  Activity as tolerated.  Follow up with Dr. Derrel Nip in a week.  Continue dialysis as before.   Time spent today on this discharge activity was 40 minutes.     ____________________________ Leia Alf Chidinma Clites, MD srs:ea D: 12/13/2012 16:07:05 ET T: 12/14/2012 04:28:44 ET JOB#: [id number]  cc: Alveta Heimlich R. Emylia Latella, MD, <Dictator> Deborra Medina, MD Neita Carp MD ELECTRONICALLY SIGNED 12/24/2012 10:52

## 2014-10-23 NOTE — Op Note (Signed)
PATIENT NAME:  David Mueller, David Mueller MR#:  [id number] DATE OF BIRTH:  11-24-1945  DATE OF PROCEDURE:  03/11/2013  PREOPERATIVE DIAGNOSIS: End-stage stage renal disease with functional peritoneal dialysis catheter and no longer needing PermCath.   POSTOPERATIVE DIAGNOSIS: End-stage renal disease with functional peritoneal dialysis catheter and no longer needing PermCath.   PROCEDURE: Removal of right jugular PermCath.   SURGEON: [place], PA-C.   ANESTHESIA: Local.   ESTIMATED BLOOD LOSS: Minimal.   INDICATION FOR PROCEDURE: This is a 69 year old male with end-stage renal disease. He is now back to using his peritoneal dialysis catheter without issues. He no longer needs his PermCath, and therefore this will be removed.   DESCRIPTION OF THE PROCEDURE: The patient is brought to the vascular interventional radiology area and positioned supine. The right neck and chest and existing catheter were sterilely prepped and draped and a sterile surgical field was created. The area was locally anesthetized copiously with 1% lidocaine. Hemostats were used to help dissect out the cuff. A #11 blade was used to transect the fibrous sheath connected to the cuff. The catheter was then removed in its entirety without difficulty with gentle traction. Pressure was held at the base of the neck. Sterile dressing was placed. The patient tolerated the procedure well. No complications.    ____________________________ Marin Shutter Knute Mazzuca, PA-C cnh:jm D: 03/11/2013 13:15:26 ET T: 03/11/2013 13:47:00 ET JOB#: [id number]  cc: Marin Shutter. Rayford Williamsen, PA-C, <Dictator> [place] PA ELECTRONICALLY SIGNED 03/15/2013 13:06

## 2014-10-23 NOTE — H&P (Signed)
PATIENT NAME:  David Mueller, David Mueller MR#:  [id number] DATE OF BIRTH:  [birth year]  DATE OF ADMISSION:  12/11/2012  PRIMARY CARE PHYSICIAN: Deborra Medina, MD  REFERRING PHYSICIAN: Valli Glance. Owens Shark, MD  CHIEF COMPLAINT: Shortness of breath.   HISTORY OF PRESENT ILLNESS: David Mueller is a 69 year old pleasant African-American male with a past medical history of hypertension, hyperlipidemia, end-stage renal disease, previously on peritoneal dialysis, recently switched to hemodialysis secondary to the planned surgery for possible nephrectomy at [facility] for tumor in the kidney. The patient undergoes hemodialysis through PermCath placed in right subclavian vein. The patient underwent hemodialysis with complete cycle of the dialysis. During the dialysis, the patient was found to be tachycardic. After he went home, he started to experience shortness of breath. The patient has been having cough with mild productive sputum. Denies having any fever. The patient states the blood pressure is usually well controlled. Has been compliant with fluid and salt restriction. Workup in the Emergency Department with CTA chest was unremarkable. Did not show any obvious infiltrates. Cardiac enzymes have been negative. ABG done in the Emergency Department showed PaO2 of 58. Concerning the patient's low PaO2 and shortness of breath, the patient is admitted to [facility] under hospitalist service.   PAST MEDICAL HISTORY:  1. Hypertension.  2. Hyperlipidemia.  3. End-stage renal disease, on peritoneal dialysis.  4. Diagnosis of left large kidney mass of 12.6 x 14.2 cm.   PAST SURGICAL HISTORY:  1. Prostate CA status post prostatectomy.  2. Laser ablation of the lumbosacral disk repair.  3. Cystoscopy with kidney stone removal.   ALLERGIES: MORPHINE AND IVP DYE.   HOME MEDICATIONS:  1. Simvastatin 20 mg once a day.  2. Renvela 800 mg 3 times a day.  3. Losartan 100 mg once a day.  4. Centrum Silver 1 tablet  once a day.  5. Amlodipine 10 mg once a day.   SOCIAL HISTORY: Smokes a pipe. Denies drinking any alcohol or using any illicit drugs. Married, lives with his wife.   FAMILY HISTORY: Mother 88, does not have any health problems. The father died about 55 to 15 years back from heart problems.   REVIEW OF SYSTEMS:  CONSTITUTIONAL: Feeling generalized weakness, fatigue.  EYES: No change in vision.  ENT: No change in hearing. No tinnitus.  RESPIRATORY: Has cough with mild productive sputum.  CARDIOVASCULAR: No chest pain, palpitations.  GENITOURINARY: End-stage renal disease, on hemodialysis.  ENDOCRINE: No polyuria or polydipsia. No increased cold or heat intolerance.  HEMATOLOGIC: No easy bruising or bleeding.  SKIN: No rash or lesions.  MUSCULOSKELETAL: Complaining of back pain.  NEUROLOGIC: No numbness or weakness in any part of the body.   PHYSICAL EXAMINATION:  GENERAL: This is a well-built, well-nourished, age-appropriate male lying down in the bed, not in distress.  VITAL SIGNS: Temperature 98.3, pulse 108, blood pressure 170/96, respiratory rate of 19, oxygen saturation is 94% on room air.  HEENT: Head normocephalic, atraumatic. Eyes: No sclerae icterus. Conjunctivae normal. Extraocular movements are intact. Mucous membranes moist. No pharyngeal erythema.  NECK: Supple. No lymphadenopathy. No JVD. No carotid bruit.  CHEST: No focal tenderness.  LUNGS: Somewhat coarse breath sounds bilaterally.  HEART: S1 and S2 regular. No murmurs are heard.  ABDOMEN: Bowel sounds present. Soft, nontender, nondistended.  EXTREMITIES: No pedal edema. Pulses 2+.  NEUROLOGIC: The patient is alert, oriented to place, person and time. Cranial nerves II through XII intact. No motor or sensory deficits.  SKIN: No rash  or lesions.  LYMPHATIC: No axillary or inguinal lymphadenopathy.   LABORATORY DATA: ABG: pH of 7.51, pCO2 of 39, PaO2 of 58. Cardiac enzymes: CK-MB less than 0.05, troponin are negative.  CBC: WBC of 12.2, hemoglobin 10.5, platelet count of 315. CMP: BUN 14, creatinine of 5.38. The rest of all the values are within normal limits. EKG, 12-lead: Normal sinus rhythm with no ST-T wave abnormalities.    ASSESSMENT AND PLAN: David Mueller is a 69 year old male who comes to the Emergency Department with complaints of shortness of breath.   1. Shortness of breath is felt to be from chronic obstructive pulmonary disease exacerbation and possible underlying pneumonia. Continue the breathing treatments, Solu-Medrol and Rocephin and Zithromax.  2. Questionable pneumonia. The patient has elevated white blood cell count. No fever. Concerning about the patient's hypoxia, ruling out other causes like pulmonary embolism, no fluid overload. Very highly concerning about underlying pneumonitis. Continue with antibiotics and follow up.  3. End-stage renal disease, on hemodialysis. The patient will need nephrology consult tomorrow for possible hemodialysis.  4. Keep the patient on deep vein thrombosis prophylaxis with heparin.   TIME SPENT: 45 minutes.   ____________________________ Monica Becton, MD pv:OSi D: 12/12/2012 08:44:00 ET T: 12/12/2012 09:45:41 ET JOB#: [id number]  cc: Monica Becton, MD, <Dictator> Deborra Medina, MD Monica Becton MD ELECTRONICALLY SIGNED 12/13/2012 8:04

## 2014-10-23 NOTE — Consult Note (Signed)
Pt without bleeding, VSS afebrile, hgb 7.5, tol full liquid diet today.  he can go home tomorrow on ultra soft diet with mashed potatoes, pudding, yogurt, ice cream, etc for 5-7 days.  If rebleeding occurs should get a stat angiographic test with embolization.  Needs follow up CBC by primary doctor or renal doctor whom he sees every 2-3 weeks.  Is already on iron pills and should stay on them.  May take stool softeners if constipation.  Electronic Signatures: Manya Silvas (MD)  (Signed on 11-Dec-14 19:57)  Authored  Last Updated: 11-Dec-14 19:57 by Manya Silvas (MD)

## 2014-10-23 NOTE — Consult Note (Signed)
PATIENT NAME:  David Mueller, David Mueller MR#:  [id number] DATE OF BIRTH:  06-08-1946  DATE OF CONSULTATION:  06/04/2013  REFERRING PHYSICIAN:  Doylene Canning, MD CONSULTING PROVIDER:  Corky Sox. Zettie Pho, PA-C ATTENDING GASTROENTEROLOGIST: Arther Dames, MD   REASON FOR CONSULTATION: Suspected lower GI bleed.   HISTORY OF PRESENT ILLNESS: This is a pleasant 69 year old African American gentleman who initially presented to the hospital with rectal bleeding. He was actually recently discharged just a few days ago, for which he was hospitalized secondary to fluid overload. He does have a history of a nephrectomy and gets peritoneal dialysis.   Beginning this past Monday night, he started noticing rectal bleeding; therefore, a family member called his physician and they thought that this may have been secondary to hemorrhoids and to keep an eye on it. The bleeding seemed to persist into yesterday and this morning, and therefore, he re-presents to the hospital for admission.   The blood was described as bright red, but it did seem mixed throughout the consistency of the stool and it is described by family member as bloody diarrhea. He does have anemia of chronic disease and this has declined, actually, with a hemoglobin down to 6.1 with an MCV of 9.4 during this hospitalization. Therefore, he is currently being transfused and it is suspicious that this is also related to his GI bleeding on top of his kidney disease.   INR was 1.0 and chest x-ray was negative. According to the patient, he has had a colonoscopy in the past. He thinks that it was within the past 10 years and was normal. He states he is up to date on his screenings, but it has been several years since he has last been evaluated. He does not believe that he has ever had an EGD before.   He is denying any associated abdominal pain with this. There is no abdominal pain, rectal pain, nausea, vomiting or dysphagia. There is no upper abdominal discomfort or trouble  with his appetite. He has no discomfort with eating and does still have an appetite. No unintentional weight changes. No chest pain or shortness of breath. No fever or chills. No recent travel or sick contacts.   PAST MEDICAL HISTORY: Dyslipidemia, hypertension, end-stage renal disease on peritoneal dialysis status post nephrectomy, history of a renal mass that was malignant, history of prostate cancer status post prostatectomy, atrial fibrillation, dyslipidemia.   PAST SURGICAL HISTORY: Prostatectomy and laser ablation secondary to prostate cancer, nephrectomy in July 2014, lithotripsy.   ALLERGIES: MORPHINE AND IV DYE.   SOCIAL HISTORY: The patient has a remote history of tobacco use. He denies any current alcohol, tobacco or illicit drug use.   FAMILY HISTORY: No known family history of GI malignancy, colon polyps or IBD.   HOME MEDICATIONS: Renvela, multivitamin, metoprolol, Megace, losartan, simvastatin, aspirin, and amiodarone.   REVIEW OF SYSTEMS: A 10-system review of systems was obtained on the patient. Pertinent positives are mentioned above and otherwise negative.   OBJECTIVE:  VITAL SIGNS: Blood pressure 130/18, heart rate 73, respirations 18, temp 98.2. Bedside pulse oximetry is 100%.  GENERAL: A pleasant 69 year old gentleman resting quietly and comfortably in bed in no acute distress. Alert and oriented x3. Currently undergoing a blood transfusion.  HEAD: Atraumatic, normocephalic.  NECK: Supple. No lymphadenopathy noted.  HEENT: Sclerae anicteric. Mucous membranes moist.  PULMONARY: Respirations even and unlabored. Clear to auscultation bilateral anterior lung fields.  CARDIAC: Regular rate and rhythm. S1, S2 noted.  ABDOMEN: Soft, nontender, nondistended. Normoactive bowel  sounds noted in all 4 quadrants. No masses palpated. No guarding or rebound. No signs of an acute abdomen.  RECTAL: Deferred.  PSYCHIATRIC: Appropriate mood and affect.  EXTREMITIES: Negative for lower  extremity edema, 2+ pulses noted bilaterally.   LABORATORY DATA: White blood cells 5.1, hemoglobin 6.1, hematocrit 18, platelets 252, MCV 94. Sodium 140, potassium 4.8, BUN 50, creatinine 14.33, glucose 116. PTT 25.4. PT 13.5. INR 1.0. Troponins were negative. Albumin 2.5, bilirubin 0.3, alk phos 40, ALT 18, AST 9. Alcohol percentage is negative. Lipase 153.   IMAGING: Chest x-ray was obtained on the patient and was negative.   ASSESSMENT:  1.  Rectal bleeding described as bright red blood mixed throughout the consistency of his stool for the past 48 hours.  2.  Symptomatic anemia with hemoglobin on admission at 6.1. The patient is currently undergoing a blood transfusion.  3.  End-stage renal disease, status post nephrectomy, currently on peritoneal hemodialysis. The patient does have an underlying history of anemia of chronic disease and a recent admission for fluid overload.  4.  Atrial fibrillation. The patient does get amiodarone but is not on any anticoagulation secondary to his chronic anemia.     PLAN: I have discussed this patient's case in detail with Dr. Arther Dames who is involved in the development of the patient's plan of care. After reviewing the patient's medical records and discussing with the patient what has been going on for the past 48 hours, it is highly suspicious of a lower gastrointestinal bleed.   We do agree with the patient being transfused and I recommend checking serial hemoglobins to ensure stability and being prepared to transfuse as necessary. The patient's last colonoscopy, he suspects, was within the past 10 years, and he is up to date as far screening is concerned, but he does state that it has been quite some time since he has been evaluated. He denies any history of an EGD.   At this point, it is unclear the etiology of the blood, but based on the description of it and the coloring with the absence of any upper GI symptoms, I am more suspicious that this is a  lower bleed. Likely the patient will benefit from endoscopic intervention to identify the bleed, and I will discuss with Dr. Rayann Heman regarding the potential role for endoscopic intervention. We will likely plan for a colonoscopy while inpatient.  In the interim, I am comfortable with the patient being on a PPI. We will recheck his hemoglobin following his transfusion and keep a close eye on this serially. Further recommendations will be made per clinical course.   These services provided by Loren Racer, NP, under collaborative agreement with Dr. Arther Dames.   Thank you so much for this consultation and for allowing [place] to participate in the patient's plan of care.   ____________________________ Corky Sox. Marylon Verno, PA-C kme:np D: 06/04/2013 16:18:12 ET T: 06/04/2013 16:32:39 ET JOB#: [id number]  cc: Corky Sox. Shristi Scheib, PA-C, <Dictator> [place] PA ELECTRONICALLY SIGNED 06/04/2013 17:46

## 2014-10-23 NOTE — Op Note (Signed)
PATIENT NAME:  David Mueller, David Mueller MR#:  [id number] DATE OF BIRTH:  03/12/1946  DATE OF PROCEDURE:  10/08/2012  PRINCIPAL DIAGNOSIS: Bladder tumor.   POSTOPERATIVE DIAGNOSIS: Bladder tumor.   PROCEDURE: Cystoscopy, bladder biopsy.   SURGEON: Dr. Edrick Oh.   ANESTHESIA: Laryngeal mask airway anesthesia.   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 He was found to have significant obstruction of the remaining portion of the left kidney, with a history of residual transitional cell carcinoma. He also underwent a recent cystoscopy demonstrating a defined area of raised, erythematous mucosa on the central trigone, suspicious for possible early transitional cell carcinoma. He presents for bladder biopsy.   PROCEDURE: After informed consent was obtained the patient was taken to the operating room and placed in the dorsal lithotomy position under laryngeal mask airway anesthesia. The patient was then prepped and draped in the usual standard fashion. The 22-French rigid cystoscope was introduced into the urethra under direct vision with no urethral abnormalities noted. Upon entering the prostatic fossa, minimal bilobar prostatic hypertrophy was noted, with only partial visual obstruction. Upon entering the bladder the mucosa was inspected in its entirety. No significant mucosal lesions were noted throughout the bladder proper. Bilateral ureteral orifices were well-visualized. The left ureteral orifice was noted to be duplicated. There is a central well-defined area of raised erythematous hypervascular mucosa between the ureteral orifices and extending onto the more distal trigone. The bladder volume was noted to be relatively small, with areas of excoriation and bleeding with any degree of bladder distention. Cold cup biopsy forceps were utilized to obtain biopsies from the trigone region. The area was then  cauterized utilizing the Bugbee electrode. The ureteral orifices were lateral to the area of cauterization. The bladder was drained. The cystoscope was removed. An 18-French red rubber catheter was then inserted into the urinary bladder. Forty mL of 2% lidocaine was instilled into the urinary bladder for local anesthesia. The catheter was removed. The patient was returned to the supine position and awakened from laryngeal mask airway anesthesia. He was taken to the recovery room in stable condition. There were no problems or complications. The patient tolerated the procedure well.   ESTIMATED BLOOD LOSS: Minimal.   SPECIMENS: Were two cores of bladder trigone tissue.    ____________________________ Denice Bors. Jacqlyn Larsen, MD bsc:dm D: 10/08/2012 08:07:00 ET T: 10/08/2012 08:18:48 ET JOB#: [id number]  cc: Denice Bors. Jacqlyn Larsen, MD, <Dictator> Denice Bors Branko Steeves MD ELECTRONICALLY SIGNED 10/08/2012 9:20

## 2014-10-23 NOTE — Consult Note (Signed)
No bleeding last 24 hours.  No bowel movement.  Discussed case with Dr. Fleet Contras.  Because he has peritoneal dialysis the choices of possible left side colectomy and arterial embolization change.  He felt that if surgery on the colon was done it could cause enough scar tissue and inflammation  to interfere with  the peritoneal dialysis.  His recommendation for a repeat diverticular  bleed would be embolization by vascular surgery.  This was discussed with his wife.  Will advance to full liquids tomorrow and he can go home Friday if no bleeding and follow up with [place] next week to plan eventual colonoscopy for polypectomy of polyps found on recent colonoscopy.   Electronic Signatures: Manya Silvas (MD)  (Signed on 10-Dec-14 17:29)  Authored  Last Updated: 10-Dec-14 17:29 by Manya Silvas (MD)

## 2014-10-23 NOTE — Op Note (Signed)
PATIENT NAME:  David Mueller, David Mueller MR#:  [id number] DATE OF BIRTH:  04-Jan-1946  DATE OF PROCEDURE:  12/05/2012  PREOPERATIVE DIAGNOSES: 1.  End-stage renal disease.  2.  Hypertension.  POSTOPERATIVE DIAGNOSES: 1.  End-stage renal disease.  2.  Hypertension.  PROCEDURES: 1.  Ultrasound guidance for vascular access to right jugular vein.  2.  Fluoroscopic guidance for placement of catheter.  3. Placement of a 19 cm tip-to-cuff tunneled hemodialysis catheter via the right internal jugular vein.   SURGEON:  Criston Chancellor.   ANESTHESIA: Local, with sedation.   BLOOD LOSS: 50 mL.   INDICATION FOR PROCEDURE: A 69 year old male with end-stage renal disease. He has a peritoneal dialysis catheter, and will have to stop peritoneal dialysis for several weeks around the time of the major surgery, and he will need hemodialysis, and a catheter is necessary.   DESCRIPTION OF THE PROCEDURE: The patient was brought to the vascular and interventional radiology suite. The patient's right neck and chest were sterilely prepped and draped, and a sterile surgical field was created. The right internal jugular vein was visualized with ultrasound and found to be patent. It was then accessed under direct ultrasound guidance and a permanent image was recorded. A wire was placed. After a skin nick and dilatation, the peel-away sheath was placed over the wire.   I then turned my attention to an area under the clavicle. Approximately 2 fingerbreadths below the clavicle a small counter incision was created and we tunneled from the subclavicular incision to the access site. Using fluoroscopic guidance, a 19 cm tip-to-cuff tunneled hemodialysis catheter was selected, tunneled from the subclavicular incision to the access site. It was then placed through the peel-away sheath, and the peel-away sheath was removed. The catheter tips were parked in the right atrium. The appropriate distal connectors were placed. It withdrew blood well and  flushed easily with heparinized saline, and a concentrated heparin solution was then placed. It was secured to the chest wall with 2 Prolene sutures. The access incision was closed with a single 4-0 Monocryl. A 4-0 Monocryl pursestring suture was placed around the exit site. Sterile dressings were placed.   The patient tolerated the procedure well and was taken to the recovery room in stable condition.     ____________________________ Algernon Huxley, MD jsd:dm D: 12/05/2012 11:03:12 ET T: 12/05/2012 12:26:02 ET JOB#: [id number]  cc: Algernon Huxley, MD, <Dictator> Deborra Medina, MD Algernon Huxley MD ELECTRONICALLY SIGNED 12/11/2012 10:58

## 2014-10-23 NOTE — H&P (Signed)
PATIENT NAME:  David Mueller, David Mueller MR#:  [id number] DATE OF BIRTH:  [birth year]  DATE OF ADMISSION:  06/09/2013  REFERRING PHYSICIAN: Dr. Mariea Clonts.   PRIMARY CARE PHYSICIAN: Dr. Derrel Nip.   CHIEF COMPLAINT: GI bleed.   HISTORY OF PRESENT ILNESS:  This is a 69 year old African American gentleman with past medical history of end-stage renal disease on hemodialysis, hypertension, hyperlipidemia, who is presenting with GI bleed. He was recently discharged on 06/06/2013, with a GI bleed presumed to be diverticular in nature. No bleeding source was found despite colonoscopy found to have multiple polyps as well as diverticula, but no active bleed. He is presenting with one day duration of bright red blood per rectum. He noticed four stools earlier today coated with blood,  followed by one large bowel movement consisted of only blood. He has no further associated symptoms including chest pain, shortness of breath, palpitations, abdominal pain. Currently, he is without complaints.   REVIEW OF SYSTEMS:  CONSTITUTIONAL: Denies fever, fatigue, weakness, pain.  EYES: Denies blurred vision, double vision, eye pain.  EARS, NOSE, THROAT: Denies tinnitus, ear pain, hearing loss.  RESPIRATORY: Denies cough, wheeze, shortness of breath.  CARDIOVASCULAR: Denies chest pain, palpitations, edema,  GASTROINTESTINAL: Denies nausea, vomiting, diarrhea. Positive for bright red blood per rectum.   GENITOURINARY: Denies dysuria, hematuria.  ENDOCRINE: Denies polyuria or thyroid problems.  HEMATOLOGY AND LYMPHATIC: Denies easy bruising. Positive for bleeding as described above.  SKIN: Denies rashes, lesions.  MUSCULOSKELETAL: Denies pain in neck, back, shoulder, knees, hips, arthritic symptoms.  NEUROLOGIC: Denies paralysis, paresthesias.  PSYCHIATRIC: Denies anxiety or depressive symptoms.  Otherwise, full review of systems performed by me is negative.   PAST MEDICAL HISTORY: Hypertension, hyperlipidemia, end-stage renal disease  on peritoneal dialysis, history of renal cell carcinoma status post nephrectomy.   SOCIAL HISTORY: Remote history of tobacco use. No alcohol or drug usage.   FAMILY HISTORY: Positive for coronary artery disease.   ALLERGIES: IV DYE AND MORPHINE.   HOME MEDICATIONS: Include amiodarone 200 mg p.o. daily, ferrous sulfate 325 mg p.o. 3 times a day, Losartan 100 mg p.o. daily, Megace 20 mL p.o. daily, metoprolol 75 mg p.o. b.i.d., multivitamin 1 tablet p.o. daily, pantoprazole 40 mg p.o. daily, Renvela  800 mg p.o. 3 times daily and 2 tablets before snacks, simvastatin 20 mg p.o. at bedtime.   PHYSICAL EXAMINATION: VITAL SIGNS: Temperature 97.6, heart rate 75, respirations 18, blood pressure 165/71, saturating 99% on room air, weight 78.5 kg, BMI 23.5.  GENERAL: Well-nourished, well-developed, African American gentleman who is currently in no acute distress.  HEAD: Normocephalic, atraumatic.  EYES: Pupils equal, round and reactive to light. Extraocular muscles intact.  No scleral icterus.  MOUTH: Moist mucous membranes. Dentition intact. No abscess noted.  EARS, NOSE, THROAT:  Throat clear without exudates. No external lesions.  NECK: Supple. No thyromegaly. No nodules. No JVD.  PULMONARY: Clear to auscultation bilaterally. No wheezes, rubs or rhonchi. No use of accessory muscles. Good respiratory effort.  CHEST: Nontender to palpation.  CARDIOVASCULAR: S1, S2, regular rate and rhythm. No murmurs, rubs or gallops. No edema. Pedal pulses 2+ bilaterally.  GASTROINTESTINAL: Soft, nontender, nondistended. No masses. Positive bowel sounds. No hepatosplenomegaly.  MUSCULOSKELETAL: No swelling, clubbing or edema. Range of motion full in all extremities.  NEUROLOGIC: Cranial nerves II through XII intact. No gross motor deficits. Sensation intact. Reflexes intact.  SKIN: No ulcerations, lesions, rashes, or cyanosis. Skin warm and dry. Turgor is intact.  PSYCHIATRIC: Mood and affect within normal limits.  The  patient is awake, alert, oriented x 3 and insight and judgment intact.   LABORATORY DATA: Sodium 140, potassium 4.5, chloride 101, bicarbonate 30, BUN 51, creatinine 15.4, glucose 105, total protein 5.4, albumin 2.3. Remainder of LFTs within normal limits. WBC 5.8, hemoglobin 8, platelets 240, INR 1.   ASSESSMENT AND PLAN: A 69 year old [place] American gentleman with history of end-stage renal disease, presenting with GI bleed.  1. Gastrointestinal bleed/bright red blood per rectum. We will consult gastroenterology. Trend CBC every 6 hours,  transfuse if hemoglobin less than 7. He recently had a colonoscopy which was unrevealing other than multiple diverticula. No source of bleeding was found. He will likely need a repeat colonoscopy. If that is unsuccessful, will need further measures to determine source of bleeding.  2.  End-stage renal disease on peritoneal dialysis. Consult nephrology for continuation of dialysis.  3.  Hypertension. Losartan and Lopressor.  4.  Paroxysmal atrial fibrillation. No anticoagulation, given active bleed. Continue with amiodarone and Lopressor. Currently in normal sinus rhythm.  5.  Deep venous thrombosis prophylaxis, sequential compression devices only.   CODE STATUS: The patient is full code.   TIME SPENT: 45 minutes    ____________________________ Aaron Mose. Annastacia Duba, MD dkh:cc D: 06/09/2013 21:00:28 ET T: 06/09/2013 21:42:03 ET JOB#: [id number]  cc: Aaron Mose. Zaakirah Kistner, MD, <Dictator> Suella Cogar Woodfin Ganja MD ELECTRONICALLY SIGNED 06/11/2013 0:30

## 2014-10-24 NOTE — Consult Note (Signed)
Brief Consult Note: Diagnosis: Abnormal GI scan results.  Results concerning for pancreatic cancer.  Known history of renal cell carcinoma treated five years ago.  s/p left nephrectomy and chemotherapy.  Prostate cancer.  Chronic renal failure.   Consult note dictated.   Orders entered.   Discussed with Attending MD.   Comments: Patient' presentation discussed with Dr. Verdie Shire.  Pending oncology evaluation and recommendations.  CA19-9 ordered.  Pending results.  Pending MR of abdomen results from today.  Will continue to monitor at this time.  Electronic Signatures: Payton Emerald (NP)  (Signed 12-Jun-15 13:22)  Authored: Brief Consult Note   Last Updated: 12-Jun-15 13:22 by Payton Emerald (NP)

## 2014-10-24 NOTE — Op Note (Signed)
PATIENT NAME:  David Mueller, SCHMIEDER MR#:  [id number] DATE OF BIRTH:  03-Oct-1945  DATE OF PROCEDURE:  12/18/2013  PREOPERATIVE DIAGNOSES: 1.  Hypotension.  2.  Rapid atrial fibrillation.  3.  End-stage renal disease.  4.  Nonfunctional left jugular catheter.   POSTOPERATIVE DIAGNOSES: 1.  Hypotension.  2.  Rapid atrial fibrillation.  3.  End-stage renal disease.  4.  Nonfunctional left jugular catheter.  PROCEDURE:  Removal and replacement through same venous access, left jugular triple-lumen catheter.   SURGEON:  Algernon Huxley, M.D.   ANESTHESIA:  Local.   ESTIMATED BLOOD LOSS:  Minimal.   INDICATION FOR PROCEDURE:  A 69 year old [place] American male with the above-mentioned issues.  I placed a jugular line earlier in the day.  This was found to be malpositioned with the catheter curled and going back up into the jugular vein.  We are attempting to salvage this venous access and place a new triple lumen catheter in the central location.   DESCRIPTION OF PROCEDURE:  The patient's existing catheter, left neck and chest were sterilely prepped and draped and a sterile surgical field was created.  I pulled the catheter back until it was only 3 to 4 cm in the jugular vein and then used the J-wire through the catheter with pressure at the clavicle to avoid any reflux of the catheter back into the jugular vein.  The catheter this time went very easily and I removed the existing catheter and replaced this with a new triple lumen catheter over the wire securing it at 18 cm at the skin with 3 silk sutures.  Prior to securing the catheter I brought a chest x-ray onto the field, performed a chest x-ray which showed the catheter tip to be in excellent location in the superior vena cava.  Sterile dressing was placed.  The patient tolerated the procedure well.    ____________________________ Algernon Huxley, MD jsd:ea D: 12/18/2013 16:49:50 ET T: 12/19/2013 02:14:26 ET JOB#: [id number]  cc: Algernon Huxley, MD,  <Dictator> Algernon Huxley MD ELECTRONICALLY SIGNED 01/05/2014 15:06

## 2014-10-24 NOTE — Consult Note (Signed)
CHIEF COMPLAINT and HISTORY:  Subjective/Chief Complaint Admitted 6/11 with abdominal pain and bloody PD fluid Consulted for bloody PD fluid and poorly functioning PD catheter   History of Present Illness Patient is a 69 year old male with ESRD on peritoneal dialysis, hx renal cell carcinoma s/p left nephrectomy and chemo, hx prostate cancer s/p prostatectomy. He has been having lower abdominal pain for several weeks. He has had blood PD fluid for about 3 days now. New pancreatic tail and splenic hilum mass. D/w Dr. Holley Raring- cathflo was used yesterday for poorly functioning PD catheter which did not seem to work. Trying again today. Per Dr. Holley Raring fluid did instill today but sluggish. He has had no fever or chills. Peritoneal fluid is negative at 36 hours.   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\}:    Hypertension:    Hypercholesterolemia:    Laser Ablation/Lumbosacral Disc Repair:    Cystoscopy with Kidney Stone Retrieval:    Prostatectomy; Total:   ALLERGIES:  Allergies:  Morphine Sulfate: Resp Arrest  HOME MEDICATIONS:  Home Medications: Medication Instructions Status  amiodarone 200 mg oral tablet 1 tab(s) orally once a day Active  Megace 40 mg/mL oral suspension 20 milliliter(s) orally once a day prn Active  losartan 100 mg oral tablet 1 tab(s) orally once a day Active  Renvela carbonate 800 mg oral tablet 3 tab(s) orally 3 times a day and 2 tablets before snacks Active  simvastatin 20 mg oral tablet 1 tab(s) orally once a day Active  metoprolol tartrate 25 mg oral tablet 1 tab(s) orally 2 times a day Active  Geritol Vitamin B Complex with C and Iron oral tablet 1 tab(s) orally once a day Active  gabapentin 300 mg oral capsule 1 cap(s) orally once a day (at bedtime) Active  calcium acetate 667  mg oral capsule 2 cap(s) orally 3 times a day (with meals) Active  Super B Complex Vitamin B Complex oral tablet 1 tab(s) orally once a day Active   Family and Social History:  Family History Coronary Artery Disease   Social History negative tobacco, negative ETOH, negative Illicit drugs   Review of Systems:  Fever/Chills No   Cough No   Sputum No   Abdominal Pain Yes   Diarrhea No   Constipation No   Nausea/Vomiting No   SOB/DOE No   Chest Pain No   Physical Exam:  GEN no acute distress   HEENT PERRL, hearing intact to voice, moist oral mucosa   NECK supple  No masses   RESP normal resp effort  clear BS   CARD regular rate  No LE edema   ABD enlarged- instilled with PD fluid now, PD catheter in place without erythema   LYMPH negative neck   EXTR negative cyanosis/clubbing   SKIN normal to palpation, No rashes   NEURO cranial nerves intact   PSYCH alert, A+O to time, place, person   LABS:  Laboratory Results: BF Analysis:    11-Jun-15 14:08, Body Fluid Nucleated Cell Count, Diff  Body Fluid Source (CC)   PERITONEAL FLUID  Color - (BF)   COLORLESS  Clarity (BF) HAZY  NCC  (nucleated cell count) 34  Neutrophils  (BF) 26  Lymphocytes  (BF) 36  Monocytes/Macrophages  (BF) 36  Eosinophil  (BF) 2  Basophil  (BF) 0  Other Cells  (BF) 0  Hepatic:    11-Jun-15 12:06, Comprehensive Metabolic Panel  Bilirubin, Total 0.2  Alkaline Phosphatase 71  45-117  NOTE: New Reference Range  05/23/13  SGPT (ALT) 21  SGOT (AST) 19  Total Protein, Serum 6.0  Albumin, Serum 2.1  LabUnknown:    11-Jun-15 14:08, Body Fluid Nucleated Cell Count, Diff  Result Interpretation   Abdominal fluid with mixed inflammation. Negative for  malignancy. T.Rubinas MD>  Routine Micro:    11-Jun-15 14:08, Body Fluid Culture w/Smear  Micro Text Report   BODY FLUID CULTURE    COMMENT                   NO GROWTH IN 36 HOURS    GRAM STAIN                RARE WHITE BLOOD  CELLS    GRAM STAIN                NO ORGANISMS SEEN     ANTIBIOTIC  Specimen Source   PERITONEAL  Culture Comment   NO GROWTH IN 36 HOURS  Gram Stain 1   RARE WHITE BLOOD CELLS  Gram Stain 2   NO ORGANISMS SEEN   Result(s) reported on 13 Dec 2013 at 11:43AM.  General Ref:    11-Jun-15 14:08, Miscellaneous Fluid (Cytology)  Miscellaneous Fluid (Cytology)   ========== TEST NAME ==========  ========= RESULTS =========  = REFERENCE RANGE =    MISC. FLUID (CYTO)    No. of containers.Marland Kitchen01 CYTYC Thin Prep Vial  Miscellaneous Fluid Cytology  Source:                         [   Final Report         ]        PERITONEAL FLUID  DIAGNOSIS:                      [   Final Report         ]                    PERITONEAL FLUID  NEGATIVE FOR MALIGNANT CELLS.  SCANT CELLULARITY.  FEW INFLAMMATORY CELLS ARE NOTED.  CYTOLOGIC FINDINGS SHOULD BE EVALUATED IN CONJUNCTION WITH CLINICAL AND  RADIOGRAPHIC FINDINGS.  Signed out by:                  [   Final Report         ]                    Sena Slate, MD, Pathologist  Performed by:                   [   Final Report         ]                    Kathrynn Running, Cytotechnologist (ASCP)  Gross description:              [   Final Report         ]                    10 ML, COLORLESS, CLOUDY  /LCS                  [facility]  [place]            No: [id number]            [address] [place], [place] [address]            Lindon Romp, MD         [phone]     Result(s) reported on 12 Dec 2013 at 04:26PM.  Cardiology:    11-Jun-15 12:06, ED ECG  Ventricular Rate 79  Atrial Rate 79  P-R Interval 130  QRS Duration 90  QT 390  QTc 447  P Axis 45  R Axis 62  T Axis 78  ECG interpretation   Normal sinus rhythm  Nonspecific T wave abnormality  Abnormal ECG  When compared with ECG of 04-Jun-2013 07:27,  Nonspecific T wave abnormality now evident in Inferior leads  ----------unconfirmed----------  Confirmed by  OVERREAD, NOT ([id number]), editor PEARSON, BARBARA ([id number]) on 12/12/2013 1:15:31 PM  ED ECG   Routine Chem:    11-Jun-15 12:06, Comprehensive Metabolic Panel  Glucose, Serum 111  BUN 42  Creatinine (comp) 11.62  Sodium, Serum 139  Potassium, Serum 3.7  Chloride, Serum 100  CO2, Serum 31  Calcium (Total), Serum 8.7  Osmolality (calc) 289  eGFR (African American) 5  eGFR (Non-African American) 4  eGFR values <57m/min/1.73 m2 may be an indication of chronic  kidney disease (CKD).  Calculated eGFR is useful in patients with stable renal function.  The eGFR calculation will not be reliable in acutely ill patients  when serum creatinine is changing rapidly. It is not useful in   patients on dialysis. The eGFR calculation may not be applicable  to patients at the low and high extremes of body sizes, pregnant  women, and vegetarians.  Anion Gap 8    11-Jun-15 14:08, Body Fluid Nucleated Cell Count, Diff  Result Comment   BODY FLUID - PATHOLOGIST TO REVIEW SMEAR. COMMENTS   - APPEAR ON REPORT WHEN COMPLETE.   Result(s) reported on 11 Dec 2013 at 03:36PM.    12-Jun-15 016:38 Basic Metabolic Panel (w/Total Calcium)  Glucose, Serum 100  BUN 48  Creatinine (comp) 11.95  Sodium, Serum 136  Potassium, Serum 3.9  Chloride, Serum 97  CO2, Serum 30  Calcium (Total), Serum 8.0  Anion Gap 9  Osmolality (calc) 285  eGFR (African American) 4  eGFR (Non-African American) 4  eGFR values <686mmin/1.73 m2 may be an indication of chronic  kidney disease (CKD).  Calculated eGFR is useful in patients with stable renal function.  The eGFR calculation will not be reliable in acutely ill patients  when serum creatinine is changing rapidly. It is not useful in   patients on dialysis. The eGFR calculation may not be applicable  to patients at the low and high extremes of body sizes, pregnant  women, and vegetarians.    12-Jun-15 03:06, Magnesium, Serum  Magnesium, Serum 2.5  1.8-2.4  THERAPEUTIC RANGE:  4-7 mg/dL  TOXIC: > 10 mg/dL   -----------------------    13-Jun-15 0446:65Basic Metabolic Panel (w/Total Calcium)  Glucose, Serum 96  BUN 57  Creatinine (comp) 13.88  Sodium, Serum 138  Potassium, Serum 4.8  Chloride, Serum 100  CO2, Serum 26  Calcium (Total), Serum 8.1  Anion Gap 12  Osmolality (calc) 291  eGFR (African American) 4  eGFR (Non-African American) 3  eGFR values <6061min/1.73 m2 may be an indication of chronic  kidney disease (CKD).  Calculated eGFR is useful in patients with stable renal function.  The eGFR calculation will not be reliable in acutely ill patients  when serum creatinine is changing rapidly. It is not useful in   patients on dialysis. The eGFR calculation may not be applicable  to patients at the low and high extremes of body sizes, pregnant  women, and vegetarians.  Routine Hem:    11-Jun-15 12:06, CBC Profile  WBC (CBC) 7.7  RBC (CBC) 3.39  Hemoglobin (CBC) 10.5  Hematocrit (CBC) 32.9  Platelet Count (CBC) 258  MCV 97  MCH 31.0  MCHC 31.9  RDW 16.7  Neutrophil % 78.9  Lymphocyte % 7.4  Monocyte % 8.8  Eosinophil % 4.0  Basophil % 0.9  Neutrophil # 6.1  Lymphocyte # 0.6  Monocyte # 0.7  Eosinophil # 0.3  Basophil # 0.1  Result(s) reported on 11 Dec 2013 at 12:30PM.    12-Jun-15 03:06, CBC Profile  WBC (CBC) 7.8  RBC (CBC) 2.95  Hemoglobin (CBC) 9.3  Hematocrit (CBC) 28.5  Platelet Count (CBC) 212  MCV 97  MCH 31.6  MCHC 32.8  RDW 16.6  Neutrophil % 71.9  Lymphocyte % 11.0  Monocyte % 12.0  Eosinophil % 4.4  Basophil % 0.7  Neutrophil # 5.6  Lymphocyte # 0.9  Monocyte # 0.9  Eosinophil # 0.3  Basophil # 0.1  Result(s) reported on 12 Dec 2013 at 04:07AM.    13-Jun-15 04:03, CBC Profile  WBC (CBC) 7.9  RBC (CBC) 3.05  Hemoglobin (CBC) 9.5  Hematocrit (CBC) 29.1  Platelet Count (CBC) 225  MCV 96  MCH 31.1  MCHC 32.5  RDW 16.6  Neutrophil % 71.4  Lymphocyte % 11.3  Monocyte % 12.4  Eosinophil % 4.4  Basophil  % 0.5  Neutrophil # 5.6  Lymphocyte # 0.9  Monocyte # 1.0  Eosinophil # 0.3  Basophil # 0.0  Result(s) reported on 13 Dec 2013 at 04:47AM.   RADIOLOGY:  Radiology Results: LabUnknown:    11-Jun-15 15:49, CT Abdomen and Pelvis With Contrast  PACS Image    12-Jun-15 11:41, MRI Abdomen Without Contrast  PACS Image  MRI:  MRI Abdomen Without Contrast  REASON FOR EXAM:    renal ca sp resection/chemo now with pancreatic mass  COMMENTS:       PROCEDURE: MR  - MR ABDOMEN WO CONTRAST  - Dec 12 2013 11:41AM     CLINICAL DATA:  Abdominal pain. Pancreatic mass. Previous left  nephrectomy for renal cell carcinoma. End-stage renal disease on  dialysis.    EXAM:  MRI ABDOMEN WITHOUT CONTRAST    TECHNIQUE:  Multiplanar multisequence MR imaging was performed without the  administration of intravenous contrast.  COMPARISON:  CT on 12/11/2013    FINDINGS:  Exam is limited by lack of intravenous contrast. There is a soft  tissue mass involving the pancreatic tail and splenic hilum which  measures approximately 4.2 x 7.3 cm. This could represent a  pancreatic adenocarcinoma or metastatic renal cell carcinoma. No  other pancreatic mass identified in this noncontrast study. No  evidence of pancreatic or biliary ductal dilatation.    Mild peripancreatic lymphadenopathy is seen superior to the pancreas  in the region of the celiac axis and portacaval space, with largest  portacaval lymph node measuring 1.7 cm on image 13 series 10. Mild  ascites is seen which is new and a small left pleural effusion  remains stable.  Fluid attenuation cyst noted in the left hepatic lobe, but no  definite liver masses visualized on this noncontrast study. Diffuse  atrophy of the right kidney is seen, consistent with end-stage renal  disease.     IMPRESSION:  Irregular soft tissue mass involving the pancreatic tail and splenic  hilum measuring approximately 4.2 x 7.3cm. This could represent a  primary  pancreatic adenocarcinoma or metastatic renal cell  carcinoma.    Mild abdominal lymphadenopathy at the celiac axis and portacaval  space. , consistent with metastatic disease.    Mild abdominal ascites and small left pleural effusion.  Electronically Signed    By: Earle Gell M.D.    On: 12/12/2013 13:15         Verified By: Marlaine Hind, M.D.,  CT:    11-Jun-15 15:49, CT Abdomen and Pelvis With Contrast  CT Abdomen and Pelvis With Contrast  REASON FOR EXAM:    (1) abd pain, bloody dialysate; (2) abd pain, bloody   dialysate  COMMENTS:       PROCEDURE: CT  - CT ABDOMEN / PELVIS  W  - Dec 11 2013  3:49PM     ADDENDUM REPORT: 12/11/2013 16:12    ADDENDUM:  These results were called by telephone at the time of interpretation  on 12/11/2013 at 4:10 PM to Dr. Milagros Evener , who verbally  acknowledged these results.      Electronically Signed    By: Inez Catalina M.D.    On: 12/11/2013 16:12    CLINICAL DATA:  Abdomen pain with blood in peritoneal dialysate    EXAM:  CT ABDOMEN AND PELVIS WITH CONTRAST    TECHNIQUE:  Multidetector CT imaging of the abdomen and pelvis was performed  using the standard protocol following bolus administration of  intravenous contrast.    CONTRAST:  80 mL Isovue 300  COMPARISON:  04/13/2012    FINDINGS:  The lung bases demonstrate some emphysematous change. A small  left-sided pleural effusion is noted.    The liver is well visualized and again demonstrates a cystic lesion  within the left lobe. The gallbladder is well distended without  cholelithiasis. The left kidney is been surgically removed. The  right kidney is small and shrunken with calculi similar to that seen  on the prior exam and consistent with the patient's clinical history  of end-stage renal disease. The right adrenal gland is unremarkable.  The left adrenal gland is not well visualized.    The pancreas is well visualized and shows pancreatic ductal  dilatation which  extends towards the head. Some irregular  enhancement is noted in the head of the pancreas best seen on image  number 61 of series 5 and coronal reconstructions. Additionally in  the tail of the pancreas there is an irregular peripherally  enhancing mass lesion which appears to be growing into the spleen.  It measures 6.9 x 4.9 cm in greatest transverse and AP dimensions  respectively. This was not present on the prior exam and is highly  suspicious for pancreatic neoplasm. With the changes in the head of  the pancreas and dilatation of the pancreatic duct this is  suspicious for multifocal pancreatic lesions. Adjacent to the body  of the pancreas there is a 2.9 x 1.8 cm soft tissue lesion likely  representing lymphadenopathy. This is best seen on image number 17  of series 2 and image 68 of series 5. It is adjacent to the previous  surgical clips from the left nephrectomy. Adenopathy is also noted  adjacent to the portacaval space best seen on image number 20  measuring 3.4 x 1.6  cm. Smaller periaortic and a retrocaval and  intra-aortic caval lymph nodes are noted. The largest of these lies  posterior to the inferior vena cava and measures 2.8 x 1.9 cm.    Some increased density is noted within the mesenteric and omental  fat in the left hemi abdomen. It is uncertain whether this is  related to the patient's known peritoneal dialysis treatment or  related to some localized omental deposits. None of these are  significant size wise. Free fluid and air is noted within the  abdomen consistent with the history of peritoneal dialysis.    Scanning into the pelvis demonstrates the peritoneal dialysis  catheter to be in satisfactory position. Surrounding fluid is noted  consistent with the recent therapy. Changes consistent with a penile  prosthesis are noted. Diverticular change is noted without evidence  of diverticulitis. The bladder is decompressed consistent with the  clinical history.  Degenerative changes of the spine are seen.     IMPRESSION:  Changes consistent with the given clinical history of peritoneal  dialysis with free air and free fluid within the abdomen.    Small left pleural effusion.    Changes consistent with pancreatic neoplasm with local invasion into  the hilum of the spleen superiorly as well as multifocal areas of  lymphadenopathy in and changes suspicious for peritoneal deposits. A  second suspicious areas noted in the region of the head of the  pancreas with dilatation of the pancreatic duct although it is  incompletely evaluated on this exam. MRI may be helpful for further  evaluation as necessary. Biopsy of this area may also be helpful.    Changes consistent with prior left nephrectomy and chronic renal  disease.    Electronically Signed:  By: Inez Catalina M.D.  On: 12/11/2013 16:06         Verified By: Everlene Farrier, M.D.,   ASSESSMENT AND PLAN:  Assessment/Admission Diagnosis Patient is a 69 year old male with ESRD on peritoneal dialysis, hx renal cell carcinoma s/p left nephrectomy and chemo, hx prostate cancer s/p prostatectomy. He has been having lower abdominal pain for several weeks. He has had blood PD fluid for about 3 days now. New pancreatic tail and splenic hilum mass. D/w Dr. Holley Raring. Cathflo was used yesterday for poorly functioning PD catheter which did not seem to work. Trying again today. Per Dr. Holley Raring fluid did instill today but sluggish. He has had no fever or chills. Peritoneal fluid is negative at 36 hours.  Currently PD cath is working. D/w Dr. Lucky Cowboy. Likely plan for perm cath on Monday for hemodialysis.   Electronic Signatures: Su Grand (PA-C)  (Signed 13-Jun-15 12:22)  Authored: Chief Complaint and History, PAST MEDICAL/SURGICAL HISTORY, ALLERGIES, HOME MEDICATIONS, Family and Social History, Review of Systems, Physical Exam, LABS, RADIOLOGY, Assessment and Plan   Last Updated: 13-Jun-15 12:22 by  Su Grand (PA-C)

## 2014-10-24 NOTE — Op Note (Signed)
PATIENT NAME:  David Mueller, David Mueller MR#:  [id number] DATE OF BIRTH:  1946-03-25  DATE OF PROCEDURE:  12/18/2013  PREOPERATIVE DIAGNOSES:  1. Rapid atrial fibrillation.  2. Hypotension.  3. End-stage renal disease.  4. Poor venous access.   POSTOPERATIVE DIAGNOSES:  1. Rapid atrial fibrillation.  2. Hypotension.  3. End-stage renal disease.  4. Poor venous access.   PROCEDURES:  1. Ultrasound guidance for vascular access, left jugular vein.  2. Placement of left jugular triple-lumen catheter.   SURGEON: Algernon Huxley, MD  ANESTHESIA: Local.   ESTIMATED BLOOD LOSS: Minimal.   INDICATION FOR PROCEDURE: A 69 year old [place] American male who we have actually seen for his dialysis access needs. He was scheduled for surgery today for a laparoscopic evaluation of his PD catheter and this had to be canceled due to rapid atrial fibrillation and hypotension.  I have been asked to place a central line for venous access given these situations. Risks and benefits were discussed with him and his wife. Informed consent was obtained.   DESCRIPTION OF PROCEDURE: The patient is laid flat in a critical care bed. His left neck was sterilely prepped and draped and a sterile surgical field was created. The left jugular vein was visualized with ultrasound and found to be patent. It was then accessed under direct ultrasound guidance without difficulty with a Seldinger needle. J-wire was then placed after skin nick and dilatation, the triple lumen catheter was placed over the wire and the wire was removed. All three lumens withdrew dark red nonpulsatile blood and flushed easily with sterile saline. It was secured in place at 18 cm with 3 silk sutures.  Stat x-ray is pending.    ____________________________ Algernon Huxley, MD jsd:dd D: 12/18/2013 16:05:59 ET T: 12/19/2013 01:52:02 ET JOB#: [id number]  cc: Algernon Huxley, MD, <Dictator> Algernon Huxley MD ELECTRONICALLY SIGNED 01/05/2014 15:06

## 2014-10-24 NOTE — Consult Note (Signed)
PATIENT NAME:  David Mueller, David Mueller MR#:  [id number] DATE OF BIRTH:  1945-09-13  DATE OF CONSULTATION:  12/12/2013  REFERRING PHYSICIAN:   CONSULTING PHYSICIAN:  Simonne Come. Inez Pilgrim, MD  HISTORY OF PRESENT ILLNESS: Mr. Besancon is a 69 year old man, who was admitted on June 11.  I was consulted on June 12. I saw and evaluated the patient on that day and placed a note on the chart and discussed his condition with nephrology and Dr. Holley Raring. This full narrative is rendered after a short delay. The pertinent history is that this patient was admitted with a few weeks of abdominal pain and then he had bloody dialysate from peritoneal dialysis. He is  a chronic dialysis patient. He could not get his peritoneal dialysis at home. He is admitted to evaluate cause of the blood and to make sure he was dialyzed on appropriate schedule. CT scan was done and later MRI of the abdomen was done as recommended. There were changes consistent with a possible pancreatic neoplasm, there was a pancreatic mass adjacent to the hilum and the spleen superiorly, multifocal areas of lymphadenopathy and another dilated area in the head of the pancreas. The patient has a significant history of, what was listed as, renal cell cancer in the initial note, status post resection and chemotherapy. 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 Two years ago he had partial nephrectomy, then he had followup, and finally he had a total nephrectomy with 18 of 20 renal nodes were positive. He was followed with serial scans that became abnormal in March. He was started on chemotherapy with weekly Taxol at [facility]. Recently, scan showed no improvement in intra-abdominal nodes and the patient discontinued therapy. He does not want any other aggressive care. See also below. 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 There is a history of prostate cancer 20 years earlier with a resection. The patient has seen Dr. Jacqlyn Larsen in the past. He has had surgery with urology, Dr. Sherral Hammers at [facility]. He has a Engineer, water at [facility] as well. This is Dr. Alexia Freestone. Additional history of hypertension, hyperlipidemia, diastolic congestive heart failure and paroxysmal atrial fibrillation.   FAMILY HISTORY: Noncontributory.   ALLERGIES: MORPHINE.   SOCIAL HISTORY: He was a smoker in the past. No alcohol. Lives with his wife.   MEDICATIONS AT TIME OF ADMISSION: Amiodarone 200 daily; calcium tablets; gabapentin 300 at night; multivitamins; losartan 100 daily; Megace twice a day; metoprolol 3 times a day; Renvela 2400 mg 3 times a day; simvastatin 20 mg daily and vitamin B complex.   REVIEW OF SYSTEMS: When I saw the patient, he was having abdominal pain, only mild mid abdomen and left lower quadrant, waxing and waning and of a cramping variety. He was not vomiting. No diarrhea. He had not complained of any headache or dizziness. No chills or sweats. No shortness of breath, cough or wheezing. No palpitations or retrosternal chest pain. No hematuria. He has made small amounts of urine. No rash. No edema. 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   PHYSICAL EXAMINATION:  GENERAL: He was alert and cooperative in no acute distress.  HEENT: Sclerae was clear.  NEUROLOGIC: Grossly nonfocal. His affect was flat. He  let his wife do most of the talking. There was no thrush.  LUNGS: Clear. No wheezing or rales.  ABDOMEN: Minimally tender in periumbilical. It was previously reported as distended, but was not simply distended when I saw the patient.  EXTREMITIES: He had had no extremity edema.  NEUROLOGIC: Grossly nonfocal.  HEART: Regular.   LABS: CTs and MRI have been described above showing adenopathy and  pancreatic or peripancreatic mass as well as some slight dilatation, some questionable additional abnormality in the head of the pancreas. His creatinine was 11, sodium was 139, bilirubin was 0.2. Liver functions were normal. White count was 7.9, hemoglobin was 10.5, platelets were 258. Peritoneal fluid was shown to be bloody. The cytology of that had turned out to be negative for malignancy.   IMPRESSION AND PLAN: Discussed extensively with the patient and his wife. Evidence is that he has progressive intra-abdominal cancer. From the description, the pancreatic mass is probably larger than what was seen in [facility], but we do not have those scans for comparison. I feel this is all progression of underlying cancer. There is no reason that the time course is wrong to suspect that he has a new or second pancreatic or other histology. He does  need an EUS or biopsy of any other investigations. He would be a candidate, if he wished, for additional therapy that would take the form of carboplatin or Gemzar and carboplatin adjusted for renal failure. He needs a dialysis catheter to get hemodialysis from now on. The patient and his wife were undecided about  whether or not they wanted any other therapy or comfort care only and they were also undecided initially about whether or not they wanted to transfer their care to [facility] or they want to continue with their primary oncologist and care team at [facility]. Subsequently, I received word that they wanted to continue all follow up at [facility] whether palliative or for additional chemotherapy, so I was notified that the patient did not need automatic followup from oncology. Please reconsult if the patient needs inpatient re-evaluation or wishes to have [facility] followup.  ____________________________ Simonne Come. Inez Pilgrim, MD rgg:aw D: 12/15/2013 08:48:27 ET T: 12/15/2013 09:07:30 ET JOB#: [id number]  cc: Simonne Come. Inez Pilgrim, MD, <Dictator> Dallas Schimke  MD ELECTRONICALLY SIGNED 01/15/2014 13:06

## 2014-10-24 NOTE — Consult Note (Signed)
General Aspect 69 year old man with a hx of  chronic diastolic heart failure and paroxysmal atrial fibrillation, history of end-stage renal disease on peritoneal dialysis, recurrent nephrolithiasis, hypertension, hyperlipidemia, renal cell carcinoma, 6 rounds of chemotherapy at [facility], blood transfusion for anemia. tobacco use   He presented to [facility] in November of 2014 with increased dyspnea and weight gain with fluid retention. noted to be in atrial fibrillation with rapid ventricular response. He had previous atrial fibrillation in October of 2013 when he was hospitalized at [facility] after complications of ureteral stenting.     The patient converted to normal sinus rhythm with amiodarone. He had an echocardiogram done during his hospitalization which showed normal LV systolic function. He is not on anticoagulation due to severe anemia.   He presented with abdominal pain for a couple of weeks. he had  bloody/red dialysis fluid from peritoneum.  He was sent for a CAT scan of abdomen and pelvis which shows changes consistent with pancreatic neoplasm with local invasion into the hilum of the spleen superiorly as well as the multifocal areas of lymphadenopathy suspicious for peritoneal deposits. A second suspicious area is noted within the head of the pancreas with dilation of the pancreatic duct.   Present Illness . FAMILY HISTORY: Coronary artery disease.   ALLERGIES: MORPHINE.   SOCIAL HISTORY: Remote tobacco history. No alcohol or drug use. Lives with wife.   Physical Exam:  GEN well developed, well nourished, no acute distress   HEENT hearing intact to voice, moist oral mucosa   NECK supple   RESP normal resp effort  clear BS   CARD Regular rate and rhythm  No murmur   ABD positive tenderness  soft   LYMPH negative neck   EXTR negative edema   SKIN normal to palpation   NEURO motor/sensory function intact   PSYCH alert, A+O to time, place, person, good insight   Review of  Systems:  Subjective/Chief Complaint weakness, anorexia   General: Weight loss or gain  Fatigue  Weakness   Skin: No Complaints   ENT: No Complaints   Eyes: No Complaints   Neck: No Complaints   Respiratory: No Complaints   Cardiovascular: No Complaints   Gastrointestinal: PD catheter with bleeding   Genitourinary: No Complaints   Vascular: No Complaints   Musculoskeletal: No Complaints   Neurologic: No Complaints   Hematologic: No Complaints   Endocrine: No Complaints   Psychiatric: No Complaints   Review of Systems: All other systems were reviewed and found to be negative   Medications/Allergies Reviewed Medications/Allergies reviewed     ESRD:    Diverticular Bleed:    Dialysis:    DJD/DDD/Bulging Lumbosacral Disc Disease:    Hx of Prostate Carcinoma:    Nephrosclerosis with Failure; Right \{Nephrolithiasis\}:    Hypertension:    Hypercholesterolemia:    Laser Ablation/Lumbosacral Disc Repair:    Cystoscopy with Kidney Stone Retrieval:    Prostatectomy; Total:   Home Medications: Medication Instructions Status  amiodarone 200 mg oral tablet 1 tab(s) orally once a day Active  Megace 40 mg/mL oral suspension 20 milliliter(s) orally once a day prn Active  losartan 100 mg oral tablet 1 tab(s) orally once a day Active  Renvela carbonate 800 mg oral tablet 4 tab(s) orally 3 times a day and 2 tablets before snacks Active  simvastatin 20 mg oral tablet 1 tab(s) orally once a day Active  metoprolol tartrate 25 mg oral tablet 1 tab(s) orally 2 times a day Active  [employer]  Vitamin B Complex with C and Iron oral tablet 1 tab(s) orally once a day Active  gabapentin 300 mg oral capsule 1 cap(s) orally once a day (at bedtime) Active  calcium acetate 667 mg oral capsule 2 cap(s) orally 3 times a day (with meals) Active  Super B Complex Vitamin B Complex oral tablet 1 tab(s) orally once a day Active   Lab Results:  Thyroid:  18-Jun-15 14:14   Thyroid  Stimulating Hormone 1.75 (0.45-4.50 (International Unit)  ----------------------- Pregnant patients have  different reference  ranges for TSH:  - - - - - - - - - -  Pregnant, first trimetser:  0.36 - 2.50 uIU/mL)  Hepatic:  18-Jun-15 14:14   Bilirubin, Total 0.4  Alkaline Phosphatase  124 (45-117 NOTE: New Reference Range 05/23/13)  SGPT (ALT) 14  SGOT (AST) 28  Total Protein, Serum  6.3  Albumin, Serum  1.6  Routine Chem:  18-Jun-15 14:14   Glucose, Serum 93  BUN  36  Creatinine (comp)  8.53  Sodium, Serum 137  Potassium, Serum 4.2  Chloride, Serum  97  CO2, Serum 26  Calcium (Total), Serum 8.5  Osmolality (calc) 282  eGFR (African American)  7  eGFR (Non-African American)  6 (eGFR values <21m/min/1.73 m2 may be an indication of chronic kidney disease (CKD). Calculated eGFR is useful in patients with stable renal function. The eGFR calculation will not be reliable in acutely ill patients when serum creatinine is changing rapidly. It is not useful in  patients on dialysis. The eGFR calculation may not be applicable to patients at the low and high extremes of body sizes, pregnant women, and vegetarians.)  Anion Gap 14   EKG:  Interpretation EKG shows NSR with rate 79 bpm, nonspecific T wave abn   Radiology Results: XRay:    18-Jun-15 16:33, Chest Portable Single View  Chest Portable Single View   REASON FOR EXAM:    line placement  COMMENTS:       PROCEDURE: DXR - DXR PORTABLE CHEST SINGLE VIEW  - Dec 18 2013  4:33PM     CLINICAL DATA:  69year old male with line placement. Initial  encounter.    EXAM:  PORTABLE CHEST - 1 VIEW    COMPARISON:69 hr the same day and earlier.    FINDINGS:  Portable AP supine view at 1618 hrs.  The left IJ approach central venous catheter has been repositioned,  an the tip now courses appropriately to the superior mediastinum,  positioned about 1 vertebral body below the carina (lower SVC  level).    Stable right chest  tunneled type dual lumen catheter. Stable low  lung volumes. No pneumothorax or pleural effusion evident on this  supine view. Stable cardiac size and mediastinal contours.  Ventilation isstable.     IMPRESSION:  1. Left IJ approach central venous catheter is been repositioned,  tip now at the lower SVC level.  2. No pneumothorax identified and otherwise stable chest.  Electronically Signed    By: LLars PinksM.D.    On: 12/18/2013 16:34         Verified By: HGwenyth Bender HALL, M.D.,    Morphine Sulfate: Resp Arrest  Vital Signs/Nurse's Notes: **Vital Signs.:   18-Jun-15 18:00  Pulse Pulse 86  Respirations Respirations 20  Systolic BP Systolic BP 1161 Diastolic BP (mmHg) Diastolic BP (mmHg) 73  Mean BP 88  Oxygen Delivery 2L    Impression 690m with RCC, mets,  on chemo here  with intraperitoneal bleed,  converted to atrial fib rith RVR this afternoon, code blue called Dr. Fletcher Anon called, Started on amiodarone infusion, converted to NSR  1)  intraperitoneal bleed,   pancreatic mets, renal CA Exp lap 6.18.by DR Lucky Cowboy   2)  ESRD,  had permcath placed June 16th Exp lap 6.18.by DR Lucky Cowboy to  evaluate where it is bleeding from and remove or reposition PD catheter.  HD 6.18 in am  3)  Pancreatic mass with h/o RCC Primary malignancy vs Met f/u at [facility]. Pt wants to follow up with oncology at [facility]    4)  Paroxysmal Afib. with RVR started on amiodarone infusion this afternoon, rate 150 to 180 bpm, hypotensive In NSR now after amiodarone infusion would continue on amiodarone 1 mg/min for now  5)  Hypotesnion  on pressors, bp stable holding BP meds,  transfusiing for post-hemotrrhagic anemia  6) Chronic diastolic chf HD as needed, appears euvolemic   Electronic Signatures: Ida Rogue (MD)  (Signed 18-Jun-15 19:02)  Authored: General Aspect/Present Illness, History and Physical Exam, Review of System, Past Medical History, Home Medications, Labs, EKG , Radiology, Allergies,  Vital Signs/Nurse's Notes, Impression/Plan   Last Updated: 18-Jun-15 19:02 by Ida Rogue (MD)

## 2014-10-24 NOTE — Consult Note (Signed)
Pt seen and examined. Please see Dawn Harrison's notes. Known hx of prostate cancer, renal cell ca, ESRD on PD. Blood on dialysate starting few days ago. CT of abdomen now show pancreatic mass. Agree with MRI. Agree with checking dialysate for cytology. Agree with oncology input. May end up needing EUS later for aspiration. Will order CA 19-9 level.Will follow. Thanks.   Electronic Signatures: Verdie Shire (MD) (Signed on 12-Jun-15 14:01)  Authored   Last Updated: 12-Jun-15 14:06 by Verdie Shire (MD)

## 2014-10-24 NOTE — Consult Note (Signed)
Brief Consult Note: Diagnosis: PERITONEAL BLEEDING, INTO DIALYSATE, ABDO PAIN.   Patient was seen by consultant.   Comments: PATIENT SEEN IN EARLY EVENING 6/12. DISCUSSED WITH DR LATEEF. 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 PRIOR TX FORST PARTIAL THEN TOTAL NEPHRECTMY, 18 OF 20 REGIONAL NODES WERE POSITIVE, SCANS BECAME ABNORMAL IN MARCH, NO RESPONSE FROM WEEKLY TAXOL. PATIENT HAS TO THIS POINT STOPPED TX, AND HAS NOT WANTED ANY OTHER AGRESSIVE CARE. NOW UNSURE BUT MAY RECONSIER A TRIAL OF CHEMOTX. ALSO UNDECIDED IF WANTS TO RETURN TO [facility] OR [facility].  CURRENT SCANS CONSISTENT WITH PROGRESSIONB OF KNOWN CANCER, DO NOT SUSPECT A NEW PANCREATIC TUMOR, DO NOT RECOMMEND EUS OR CT GUIDED BX.Marland KitchenPLAN, WILL NEED CATHETER FOR HEMODIALYSIS. WILL CONSULT RADIATION ONCOLOGY. WILL DICUSS WITH UROLOGY, DR COPE AND DR WOODS AT [facility] BUT DO NOT THINK SURGERY HAS ANYTHING TO OFFER. CAN OFFER TRIAL OF CARBOPLTIN /GEMZAR, DOSE ADJUSTED FOR RENAL FAILURE AND DIALYSIS.  Electronic Signatures: Dallas Schimke (MD)  (Signed 13-Jun-15 00:46)  Authored: Brief Consult Note   Last Updated: 13-Jun-15 00:46 by Dallas Schimke (MD)

## 2014-10-24 NOTE — Consult Note (Signed)
PATIENT NAME:  David, Mueller MR#:  [id number] DATE OF BIRTH:  10/03/1945  DATE OF CONSULTATION:  12/12/2013  REFERRING PHYSICIAN:  Vivien Presto, MD CONSULTING PHYSICIAN:  Payton Emerald, NP  PRIMARY CARE PHYSICIAN: Dr. Petra Kuba  PRIMARY NEPHROLOGIST: Dr. Holley Raring  PRIMARY ONCOLOGIST: Dr. Vito Berger at [place]: [facility]; the patient is unable to recall name.   HISTORY OF PRESENT ILLNESS: David Mueller is a 69 year old gentleman who has a known history of renal cell carcinoma as well as prostate cancer. The patient receives peritoneal dialysis on a nightly basis. The patient was diagnosed with renal cell carcinoma approximately 5 years ago, status post left nephrectomy and chemotherapy, which was done at [facility], a history of prostate cancer which was diagnosed 20 years ago status post prostatectomy. The patient had been doing well up until 3 nights prior to his hospitalization date. He started to notice evidence of red color fluid, which was noted at the time in which dialysis was performed. He had notified Dr. Holley Raring who advised him to present to the Emergency Room for evaluation in association of abdominal pain bilaterally to lower quadrant of abdomen as well as right upper quadrant. He denies any nausea or vomiting. Weight has been essentially stable. Bowels normally move on average twice a day. Some dizziness over the past 2 weeks, but otherwise he had been able to perform his normal activities of daily life. Acid reflux at times at that night depending on what he eats.   CT scan of abdomen and pelvis with contrast was done on June 11th which revealed the liver to be visualized, but again demonstrated a cystic lesion within the left lobe. Gallbladder wall distended without cholelithiasis. Left kidney again surgically removed. Right kidney small, sunken. Right adrenal gland unremarkable. Left adrenal gland not well visualized. Pancreas was well visualized showing pancreatic ductal dilatation  which extends towards the head. Some irregular enhancement was noted in the head of the pancreas. The tail of the pancreas was irregular peripherally. Enhancing mass lesion which appears to be growing into the spleen and measures 6.9 x 4.9 cm in the greatest transverse and AP dimensions, respectively. Changes within the head of the pancreas and dilatation of the pancreatic duct, suspicious for multifocal pancreatic lesions. Adjacent to the body of the pancreas there was a 2.9 x 1.8 cm soft tissue lesion likely representing lymphadenopathy. Small periaortic and retrocaval and intraaortic caval lymph nodes were noted. Scanning in the pelvic region demonstrates the peritoneal dialysis catheter to be in satisfactory position. Surrounding fluid is noted consistent with recent therapy. Also noted is a penile prosthesis. Diverticular changes noted without evidence of diverticulitis. Degenerative changes of the spine are noted.   HOME MEDICATIONS: Amiodarone 200 mg daily, calcium acetate 665 mg 2 capsules 3 times daily with meals, gabapentin 300 mg at bedtime, vitamin with iron once a day, losartan 100 mg daily, Megace 40 mg/mL 20 mL once a day as needed, metoprolol tartrate 25 mg 2 times a day, Renvela 800 mg 3 tablets 3 times a day with 2 tablets before snacks, simvastatin 20 mg a day, super B complex vitamin once a day.   ALLERGIES: MORPHINE.   PAST MEDICAL AND SURGICAL HISTORY: End-stage renal disease on peritoneal dialysis, renal cell carcinoma status post left nephrectomy, prostate cancer 20 years ago status post prostatectomy, hypertension, hyperlipidemia, peritoneal dialysis, history of chronic diastolic congestive heart failure,  and paroxysmal atrial fibrillation.   FAMILY HISTORY: Mom with coronary artery disease. No history of  neoplasm.   SOCIAL HISTORY: Remote tobacco use. No alcohol or drug use. Resides with his wife.   REVIEW OF SYSTEMS:  CONSTITUTIONAL: Denies any weight changes. No fevers. No  chills. No night sweats.  EYES: No blurred vision, double vision.  EARS, NOSE AND THROAT: No tinnitus, hearing loss.  RESPIRATORY: No coughing. No wheezing, although chronic dyspnea on exertion.  CARDIOVASCULAR: No chest pain, heart palpitations.  GASTROINTESTINAL: See HPI. GENITOURINARY: Only able to void a small amount. No dysuria. Does perform peritoneal dialysis.  HEME AND LYMPH: Significant for easy bruising. No bleeding. Chronic anemia.  SKIN: No rashes. No lesions.  MUSCULOSKELETAL: No arthralgias or myalgias.  NEUROLOGIC: History of neuropathy to his feet.  PSYCHIATRIC: No depression. No anxiety.   PHYSICAL EXAMINATION: VITAL SIGNS: Temperature is 98.5 with a pulse of 74, respirations 18, blood pressure 121/72, and pulse ox 95% on room air.  GENERAL: Well-developed, well-nourished 69 year old gentleman, in no acute distress noted, pleasant.  HEENT: Normocephalic, atraumatic. Pupils equal and reactive to light. Conjunctivae clear. Sclerae anicteric.  NECK: Supple. Trachea midline. No lymphadenopathy or thyromegaly.  PULMONARY: Symmetric rise and fall of chest. Scattered expiratory wheezes.  CARDIOVASCULAR: Regular rate and rhythm. S1, S2. No murmurs.  ABDOMEN: Soft, nondistended. Bowel sounds in 4 quadrants. No bruits. Peritoneal catheter present. Site without evidence of inflammation, infection. Tenderness noted in right upper quadrant, right lower quadrant and suprapubic area.  RECTAL: Deferred.  MUSCULOSKELETAL: Movement of all 4 extremities. No contractures. No clubbing.  EXTREMITIES: No edema.  PSYCHIATRIC: Alert and oriented x4 and memory grossly intact.  NEUROLOGIC: No gross neurological deficits.   DIAGNOSTIC DATA: CT scan results as noted under history. Chemistry panel: Glucose is 111, BUN 42, and creatinine 11.62 with a EGFR of 5 on admission. On today's date, BUN is 48, creatinine 11.95, and EGFR is 4. Chloride has dropped from 100 to 97. Hepatic panel: Total protein 6  with an albumin of 2.1. Hemoglobin on admission 10.5 with hematocrit of 32.9. Today hemoglobin 9.3 with a hematocrit of 28.5. White count has remained within level of 7.7 and 7.8. Peritoneal fluid was sent off to cytology. Color is colorless. Clarity is hazy. Nucleated cell count is 34, neutrophils 26, lymphocytes 36%, monocytes/macrophage 36%, and eosinophils, basophils, other cells are zero. Body fluid culture reveals no growth in 8 to 12 hours.   IMPRESSION: 1.  Abnormal GI scan results concerning for pancreatic cancer. The patient does have a known history of renal cell carcinoma as well as prostate cancer.  2.  Known history of chronic renal failure.   PLAN: The patient's presentation was discussed with Dr. Verdie Shire. CA-19-9 level ordered. Pending oncology input and evaluation as well as recommendations. Pending MRI of abdomen results. MRI of abdomen was performed today. Depending on MRI results the patient may benefit with proceeding forward with EUS to allow further imaging as well as biopsy of the pancreas to be obtained. We will monitor the patient's presentation at this time.   These services provided by Payton Emerald, MS, APRN, [facility], FNP under collaborative agreement with Verdie Shire, MD.   ____________________________ Payton Emerald, NP dsh:sb D: 12/12/2013 13:19:55 ET T: 12/12/2013 14:10:17 ET JOB#: [id number]  cc: Payton Emerald, NP, <Dictator> Payton Emerald MD ELECTRONICALLY SIGNED 12/18/2013 17:23

## 2014-10-24 NOTE — Consult Note (Signed)
Chief Complaint:  Subjective/Chief Complaint Some bleeding from peritoneal dialysate. Initial cytology neg for malignancy. Wife tells me that CT in 1/15 done at [facility] showed signif lymphadenopathy. Received chemo. Repeat CT in 3/15 again showed signif lymphadenopathy. Was recommended to receive more chemo, which patient refused at the time. Pancreatic mass is new. Had significant diverticular bleed, seen by both Drs. Rayann Heman and [place]. Multiple tiny polyps were seen as well but not removed. Meckel scan was negative. Currently, no GI bleeding. MRI results discussed with patient and wife. MRI only showed mass at the tail end of pancreas extending to spleen.   VITAL SIGNS/ANCILLARY NOTES: **Vital Signs.:   13-Jun-15 04:33  Vital Signs Type Routine    08:59  Vital Signs Type Routine  Pulse Pulse 79  Respirations Respirations 18  Systolic BP Systolic BP 97  Diastolic BP (mmHg) Diastolic BP (mmHg) 63  Mean BP 74  Pulse Ox % Pulse Ox % 94  Pulse Ox Activity Level  At rest  Oxygen Delivery Room Air/ 21 %   Brief Assessment:  GEN no acute distress   Cardiac Regular   Respiratory clear BS   Gastrointestinal Normal   Gastrointestinal details normal Soft  Nontender  Nondistended  No masses palpable  Bowel sounds normal  mild tenderness in RLQ area.   Lab Results:  Routine Chem:  13-Jun-15 04:03   Glucose, Serum 96  BUN  57  Creatinine (comp)  13.88  Sodium, Serum 138  Potassium, Serum 4.8  Chloride, Serum 100  CO2, Serum 26  Calcium (Total), Serum  8.1  Anion Gap 12  Osmolality (calc) 291  eGFR (African American)  4  eGFR (Non-African American)  3 (eGFR values <67m/min/1.73 m2 may be an indication of chronic kidney disease (CKD). Calculated eGFR is useful in patients with stable renal function. The eGFR calculation will not be reliable in acutely ill patients when serum creatinine is changing rapidly. It is not useful in  patients on dialysis. The eGFR calculation may not be  applicable to patients at the low and high extremes of body sizes, pregnant women, and vegetarians.)  Routine Hem:  13-Jun-15 04:03   WBC (CBC) 7.9  RBC (CBC)  3.05  Hemoglobin (CBC)  9.5  Hematocrit (CBC)  29.1  Platelet Count (CBC) 225  MCV 96  MCH 31.1  MCHC 32.5  RDW  16.6  Neutrophil % 71.4  Lymphocyte % 11.3  Monocyte % 12.4  Eosinophil % 4.4  Basophil % 0.5  Neutrophil # 5.6  Lymphocyte #  0.9  Monocyte # 1.0  Eosinophil # 0.3  Basophil # 0.0 (Result(s) reported on 13 Dec 2013 at 04:47AM.)   Radiology Results: MRI:    12-Jun-15 11:41, MRI Abdomen Without Contrast  MRI Abdomen Without Contrast   REASON FOR EXAM:    renal ca sp resection/chemo now with pancreatic mass  COMMENTS:       PROCEDURE: MR  - MR ABDOMEN WO CONTRAST  - Dec 12 2013 11:41AM     CLINICAL DATA:  Abdominal pain. Pancreatic mass. Previous left  nephrectomy for renal cell carcinoma. End-stage renal disease on  dialysis.    EXAM:  MRI ABDOMEN WITHOUT CONTRAST    TECHNIQUE:  Multiplanar multisequence MR imaging was performed without the  administration of intravenous contrast.  COMPARISON:  CT on 12/11/2013    FINDINGS:  Exam is limited by lack of intravenous contrast. There is a soft  tissue mass involving the pancreatic tail and splenic hilum which  measures approximately  4.2 x 7.3 cm. This could represent a  pancreatic adenocarcinoma or metastatic renal cell carcinoma. No  other pancreatic mass identified in this noncontrast study. No  evidence of pancreatic or biliary ductal dilatation.    Mild peripancreatic lymphadenopathy is seen superior to the pancreas  in the region of the celiac axis and portacaval space, with largest  portacaval lymph node measuring 1.7 cm on image 13 series 10. Mild  ascites is seen which is new and a small left pleural effusion  remains stable.  Fluid attenuation cyst noted in the left hepatic lobe, but no  definite liver masses visualized on this  noncontrast study. Diffuse  atrophy of the right kidney is seen, consistent with end-stage renal  disease.     IMPRESSION:  Irregular soft tissue mass involving the pancreatic tail and splenic  hilum measuring approximately 4.2 x 7.3cm. This could represent a  primary pancreatic adenocarcinoma or metastatic renal cell  carcinoma.    Mild abdominal lymphadenopathy at the celiac axis and portacaval  space. , consistent with metastatic disease.    Mild abdominal ascites and small left pleural effusion.  Electronically Signed    By: Earle Gell M.D.    On: 12/12/2013 13:15         Verified By: Marlaine Hind, M.D.,   Assessment/Plan:  Assessment/Plan:  Assessment Pancreatic tail mass. Believe this is spread from his kidney cancer. Doubt this is a primary pancreatic cancer. However, will check CA 19-9. There is no signs of GI bleeding.   Plan If bleeding from pertoneal fluid continues AND cytology remains negative, then consider having Dr. Delana Meyer evaluate PD catheter. Nothing further to add from GI point of view. Will need more chemotherapy at [facility]. Will sign off. Call us back if there are questions. Thanks.   Electronic Signatures: Verdie Shire (MD)  (Signed 13-Jun-15 10:29)  Authored: Chief Complaint, VITAL SIGNS/ANCILLARY NOTES, Brief Assessment, Lab Results, Radiology Results, Assessment/Plan   Last Updated: 13-Jun-15 10:29 by Verdie Shire (MD)

## 2014-10-24 NOTE — H&P (Signed)
PATIENT NAME:  David Mueller, David Mueller MR#:  [id number] DATE OF BIRTH:  [birth year]  DATE OF ADMISSION:  12/11/2013  REFERRING PHYSICIAN: Dr. Winfred Leeds    PRIMARY CARE PHYSICIAN: Dr. Petra Kuba  PRIMARY NEPHROLOGIST: Dr. Holley Raring   PRIMARY ONCOLOGIST: [facility]. Vito Berger at [facility].   CHIEF COMPLAINT: Abdominal pain.   HISTORY OF PRESENT ILLNESS: The patient is a pleasant 69 year old African American gentleman with a history of renal cell carcinoma, status post resection and chemotherapy who finished chemotherapy in March, peritoneal dialysis, history of prostate cancer more than 20 years ago, who comes in after getting sent to the ER by his nephrologist. The patient, of note, has been having abdominal pain for a couple of weeks. Yesterday, he had some, bloody/red dialysis fluid from peritoneum. The pain is more generalized but at times could also be below the dialysis catheter. He denies having any weight loss, fevers, chills. He was sent for a CAT scan of abdomen and pelvis which shows changes consistent with pancreatic neoplasm with local invasion into the hilum of the spleen superiorly as well as the multifocal areas of lymphadenopathy suspicious for peritoneal deposits. A second suspicious area is noted within the head of the pancreas with dilation of the pancreatic duct. An MRI is recommended, and therefore, hospitalist services were contacted for further evaluation and management.   PAST MEDICAL HISTORY: History of end-stage renal disease on peritoneal dialysis.  Renal cell carcinoma status post a left nephrectomy last year at [facility] and status post chemotherapy. History of prostate cancer more than 20 years ago status post prostatectomy. Hypertension; hyperlipidemia; peritoneal dialysis; history of diastolic CHF, chronic; paroxysmal atrial fibrillation.   FAMILY HISTORY: Coronary artery disease.   ALLERGIES: MORPHINE.   SOCIAL HISTORY: Remote tobacco history. No alcohol or drug use. Lives with wife.    CURRENT MEDICATIONS: Amiodarone 200 mg daily, calcium acetate 667 mg 2 caps 3 times a day with meals, gabapentin 300 mg at bedtime, vitamins with iron 1 tablet daily, losartan 100 mg daily, Megace 40 mg/mL 20 mL once a day as needed, metoprolol tartrate 25 mg 2 times a day, Renvela 800 mg 3 tabs 3 times a day with 2 tablets before snacks, simvastatin 20 mg daily, Super B complex vitamin once a day.   REVIEW OF SYSTEMS:  CONSTITUTIONAL: Denies having any weight changes. No fevers, chills, or night sweats.  EYES: No blurry vision or double vision.  EARS, NOSE, AND THROAT:  No tinnitus or hearing loss.  RESPIRATORY: No cough or wheezing. Has chronic dyspnea on exertion.  CARDIOVASCULAR: No chest pain. No palpitations.  Has history of diastolic CHF and paroxysmal atrial fibrillation.  GASTROINTESTINAL: No nausea, vomiting, diarrhea, bloody or tarry stools. Has abdominal pain as above, also some reddish dialysis fluid yesterday and today.  GENITOURINARY: Denies dysuria, hematuria. Only pees a little bit. HEMATOLOGIC AND LYMPHATIC: Some easy bruising. No bleeding. Has chronic anemia.  SKIN: No rashes.  MUSCULOSKELETAL: Denies arthritis or gout.  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  PSYCHIATRIC: Has no anxiety or depression.   PHYSICAL EXAMINATION:  VITAL SIGNS: Temperature on arrival 98.1, pulse rate 79, respiratory rate 16, blood pressure 102/49, oxygen saturation 96% on room air.  GENERAL: The patient is a well-developed male lying in bed, no obvious distress.  HEENT: Normocephalic, atraumatic. Pupils are equal and reactive. Anicteric sclerae. Moist mucous membranes.  NECK: Supple. No thyroid tenderness. No cervical lymphadenopathy.  CARDIOVASCULAR: S1, S2 regular. No significant murmurs were appreciated.  LUNGS: Mild bilateral basilar crackles. No wheezing or rhonchi.  ABDOMEN: Soft. No significant tenderness.  It is somewhat distended. He has a  healed surgical scar below the abdomen and he also has a peritoneal catheter. There is no significant drainage or evidence for cellulitis around the catheter.  EXTREMITIES: No pitting edema.  NEUROLOGIC: Cranial nerves II through XII grossly intact. Strength is 5/5 in all extremities. Sensation intact to light touch.   PSYCHIATRIC: Awake, alert and oriented x 3.   LABORATORIES AND IMAGING: CT of abdomen and pelvis as above, BUN 42, creatinine 11.62, sodium 139, potassium 3.7, albumin 2.1, total protein 6, bilirubin 0.2, alkaline phosphatase 71, AST and ALT within normal limits. White count 7.9, hemoglobin 10.5, platelets  258,000. Peritoneal fluid sent from the dialysis fluid shows nucleated cells of 34, neutrophils 26, lymphocytes 36, monocytes 36.   EKG: Normal sinus rhythm, rate of 79. No acute ST changes.   ASSESSMENT AND PLAN: This is a 69 year old with a history of renal cell carcinoma status post resection who underwent chemotherapy with history of being on peritoneal dialysis with abdominal pain. At this point, we will admit the patient for observation. His symptomatology with CAT scan findings is suggestive of malignancy. I do not know if this is metastatic disease from the renal cell carcinoma or this is a neopancreatic malignancy, but at this point, we would order an MRI of the abdomen. We will also obtain oncology and GI consult for further workup. Of note, patient has no significant weight loss. No significant obstructive signs based on the LFT, which is a little odd if this is a primary pancreatic cancer, although primary pancreatic cancer is still possible. I would add a cytology to the fluid already taken from the peritoneum. I have discussed the case with his nephrologist who also wants to hold on PT at this point until we have further information. Would continue his blood pressure medications, wait for GI and on-call workup, start him on dialysis diet.  The  peritoneal fluid does not hint  of infection. He has no fever or significant white count that suggests he has active peritonitis and his abdomen is not exhibiting any peritoneal signs.   CODE STATUS: The patient is full code.   TOTAL TIME SPENT:  50 minutes.   ____________________________ Vivien Presto, MD sa:dd D: 12/11/2013 18:08:47 ET T: 12/11/2013 18:40:13 ET JOB#: [id number]  cc: Vivien Presto, MD, <Dictator> [place] Petra Kuba, MD [place], MD Dr. Carlis Stable [facility] MD ELECTRONICALLY SIGNED 12/30/2013 18:18

## 2014-10-24 NOTE — Discharge Summary (Signed)
PATIENT NAME:  David Mueller, KNECHT MR#:  [id number] DATE OF BIRTH:  25-Jul-1945  DATE OF ADMISSION:  12/12/2013 DATE OF DISCHARGE:    Please refer to the interim summary just dictated yesterday, 12/21/2013, by Dr. Ether Griffins.    ADMISSION DIAGNOSIS: Intraperitoneal bleed.   DISCHARGE DIAGNOSES:   1.  Acute posthemorrhagic anemia, status post 3 units of packed red blood cells.   2.  Suspected intraperitoneal bleed.  3.  End-stage renal disease, now on hemodialysis with a peritoneal dialysis that seems to be not functioning.  4.  Pancreatic mass with history of kidney cancer 5.  Paroxysmal atrial fibrillation, currently in sinus rhythm.  6.  Hypotension initially, now normotensive.  7.  Chronic diastolic heart failure.   CONSULTATIONS:   1.  Dr. Lucky Cowboy from vascular surgery.  2.  Nephrology.  3.  GI, Dr. Candace Cruise.   4.  Dr. Inez Pilgrim from oncology.   5.  Dr. Rockey Situ from cardiology.    LABORATORY, DIAGNOSTIC AND RADIOLOGICAL DATA: 1.  Chest x-ray on 12/22/2013 showed some small bilateral pleural effusions.  2.  Lower extremity Doppler negative for deep vein thrombosis.  3.  Discharge sodium 135, potassium 5.5, chloride 99, bicarbonate 28, BUN 64, creatinine 9.42, glucose 95.  White blood cells 10.5, hemoglobin 9.8, hematocrit 32, platelets 313.     HOSPITAL COURSE: This is a 69 year old male who was admitted on June 11th with abdominal pain. He had a CAT scan which showed changes consistent with pancreatic neoplasm with local invasion into the hilum.  For further information, please refer to H and P and also please refer to the interim summary.  1.  Intraperitoneal bleed of unclear etiology at this time.  Laparotomy was planned by Dr. Lucky Cowboy but it has not been performed.  Therefore, the family is asking for transfer to [facility] for evaluation of peritoneal dialysis catheter and possible intraperitoneal bleed. His PD catheter was bleeding. He required a couple of units of blood transfusion due to the significant  bleeding and hypotension. His hemoglobin now has remained stable.  2.  Acute posthemorrhagic anemia, status post 3 units of packed red blood cells.   3.  End-stage renal disease. He is now converted from peritoneal to a hemodialysis. His PermCath was placed on June 16th.  Nephrology has been seeing him.  4.  Pancreatic cancer. The patient was seen by Dr. Inez Pilgrim, our oncologist.  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 Apparently, he stopped treatment and no other aggressive care at this time. A new pancreatic lesion was found on the CAT scan. There was some question whether he will need EUS.  The patient actually wanted to follow up with oncology at [facility] so no further workup has been done.  5.  Paroxysmal atrial fibrillation.  He was in rapid ventricular response on 12/19/2013, converted to sinus rhythm.  He is on amiodarone.   6.  Hypotension. Initially, blood pressure medications were held. Now with pressure improved he is on metoprolol.  7.  Chronic diastolic failure. Seems stable at this point.   DISCHARGE MEDICATIONS: 1.  Renvela 800 mg 4 tablets t.i.d. and 2 tablets before snacks.   2.  Simvastatin 20 mg daily.   3.  Metoprolol 25 b.i.d. 4.  Gabapentin 300 mg q.h.s.  5.  Amiodarone 400 mg b.i.d.  6.  Oxycodone 5 mg q.4 hours p.r.n.  7.  Ranitidine 150 q.h.s.  8.  Nephro-carb steady supplement t.i.d. with  meals.   DISCHARGE OXYGEN: At 3 liters nasal cannula.   DISPOSITION:  The patient to be transferred to [facility] when bed is available.nephrology service is the accepting physician.   TIME SPENT:  Approximately 55 minutes.    Plan of care was discussed with the family, who wanted me to initiate the transfer.   ____________________________ Avari Gelles P. Benjie Karvonen, MD spm:cs D: 12/22/2013 17:33:00 ET T: 12/22/2013 18:08:56 ET JOB#: [id number]  cc: Caileen Veracruz P. Benjie Karvonen, MD, <Dictator> Donell Beers Merlyn Conley MD ELECTRONICALLY SIGNED 12/23/2013 15:29

## 2017-03-28 NOTE — Telephone Encounter (Signed)
Error
